# Patient Record
Sex: Male | Born: 1975 | Race: Black or African American | Hispanic: No | Marital: Single | Smoking: Heavy tobacco smoker
Health system: Southern US, Community
[De-identification: ages and names within clinical notes are randomized; demographics above are authoritative.]

## PROBLEM LIST (undated history)

## (undated) DIAGNOSIS — F32A Depression, unspecified: Secondary | ICD-10-CM

## (undated) DIAGNOSIS — Z789 Other specified health status: Secondary | ICD-10-CM

## (undated) DIAGNOSIS — F329 Major depressive disorder, single episode, unspecified: Secondary | ICD-10-CM

## (undated) DIAGNOSIS — F319 Bipolar disorder, unspecified: Secondary | ICD-10-CM

## (undated) HISTORY — PX: ACNE CYST REMOVAL: SUR1112

---

## 2002-01-23 ENCOUNTER — Emergency Department (HOSPITAL_COMMUNITY): Admission: EM | Admit: 2002-01-23 | Discharge: 2002-01-23 | Payer: Self-pay | Admitting: Emergency Medicine

## 2002-01-23 ENCOUNTER — Encounter: Payer: Self-pay | Admitting: Emergency Medicine

## 2002-09-19 ENCOUNTER — Emergency Department (HOSPITAL_COMMUNITY): Admission: EM | Admit: 2002-09-19 | Discharge: 2002-09-19 | Payer: Self-pay | Admitting: Emergency Medicine

## 2004-10-20 ENCOUNTER — Emergency Department (HOSPITAL_COMMUNITY): Admission: EM | Admit: 2004-10-20 | Discharge: 2004-10-20 | Payer: Self-pay | Admitting: *Deleted

## 2004-10-23 ENCOUNTER — Emergency Department (HOSPITAL_COMMUNITY): Admission: EM | Admit: 2004-10-23 | Discharge: 2004-10-23 | Payer: Self-pay | Admitting: Family Medicine

## 2004-10-27 ENCOUNTER — Emergency Department (HOSPITAL_COMMUNITY): Admission: EM | Admit: 2004-10-27 | Discharge: 2004-10-27 | Payer: Self-pay | Admitting: Family Medicine

## 2005-11-16 ENCOUNTER — Emergency Department (HOSPITAL_COMMUNITY): Admission: EM | Admit: 2005-11-16 | Discharge: 2005-11-16 | Payer: Self-pay | Admitting: Emergency Medicine

## 2005-12-18 ENCOUNTER — Emergency Department (HOSPITAL_COMMUNITY): Admission: EM | Admit: 2005-12-18 | Discharge: 2005-12-18 | Payer: Self-pay | Admitting: Emergency Medicine

## 2006-09-01 ENCOUNTER — Emergency Department (HOSPITAL_COMMUNITY): Admission: EM | Admit: 2006-09-01 | Discharge: 2006-09-01 | Payer: Self-pay | Admitting: Emergency Medicine

## 2007-01-04 ENCOUNTER — Emergency Department (HOSPITAL_COMMUNITY): Admission: EM | Admit: 2007-01-04 | Discharge: 2007-01-04 | Payer: Self-pay | Admitting: Emergency Medicine

## 2010-02-06 ENCOUNTER — Emergency Department (HOSPITAL_COMMUNITY): Admission: EM | Admit: 2010-02-06 | Discharge: 2010-02-07 | Payer: Self-pay | Admitting: Emergency Medicine

## 2011-01-13 LAB — CBC
HCT: 39.9 % (ref 39.0–52.0)
Hemoglobin: 13.7 g/dL (ref 13.0–17.0)
MCHC: 34.4 g/dL (ref 30.0–36.0)
MCV: 93.9 fL (ref 78.0–100.0)
Platelets: 191 10*3/uL (ref 150–400)
RBC: 4.25 MIL/uL (ref 4.22–5.81)
RDW: 13.5 % (ref 11.5–15.5)
WBC: 10.7 10*3/uL — ABNORMAL HIGH (ref 4.0–10.5)

## 2011-01-13 LAB — DIFFERENTIAL
Basophils Relative: 0 % (ref 0–1)
Lymphs Abs: 2.1 10*3/uL (ref 0.7–4.0)
Monocytes Absolute: 1.4 10*3/uL — ABNORMAL HIGH (ref 0.1–1.0)
Monocytes Relative: 13 % — ABNORMAL HIGH (ref 3–12)
Neutro Abs: 7 10*3/uL (ref 1.7–7.7)
Neutrophils Relative %: 65 % (ref 43–77)

## 2011-01-13 LAB — POCT I-STAT, CHEM 8
BUN: 22 mg/dL (ref 6–23)
Calcium, Ion: 1.12 mmol/L (ref 1.12–1.32)
Chloride: 105 mEq/L (ref 96–112)
Creatinine, Ser: 0.9 mg/dL (ref 0.4–1.5)
Glucose, Bld: 97 mg/dL (ref 70–99)
HCT: 43 % (ref 39.0–52.0)
Hemoglobin: 14.6 g/dL (ref 13.0–17.0)
Potassium: 4 mEq/L (ref 3.5–5.1)
Sodium: 138 meq/L (ref 135–145)
TCO2: 25 mmol/L (ref 0–100)

## 2011-01-19 ENCOUNTER — Emergency Department (HOSPITAL_COMMUNITY)
Admission: EM | Admit: 2011-01-19 | Discharge: 2011-01-19 | Disposition: A | Discharge status: Home / Self Care | Payer: Self-pay | Attending: Emergency Medicine | Admitting: Emergency Medicine

## 2011-01-19 DIAGNOSIS — N342 Other urethritis: Secondary | ICD-10-CM | POA: Insufficient documentation

## 2011-01-19 DIAGNOSIS — R300 Dysuria: Secondary | ICD-10-CM | POA: Insufficient documentation

## 2011-01-19 DIAGNOSIS — R369 Urethral discharge, unspecified: Secondary | ICD-10-CM | POA: Insufficient documentation

## 2011-01-19 LAB — URINALYSIS, ROUTINE W REFLEX MICROSCOPIC
Bilirubin Urine: NEGATIVE
Glucose, UA: NEGATIVE mg/dL
Hgb urine dipstick: NEGATIVE
Ketones, ur: NEGATIVE mg/dL
Nitrite: NEGATIVE
Specific Gravity, Urine: 1.022 (ref 1.005–1.030)
pH: 6 (ref 5.0–8.0)

## 2011-01-20 LAB — GC/CHLAMYDIA PROBE AMP, GENITAL: Chlamydia, DNA Probe: NEGATIVE

## 2011-01-22 ENCOUNTER — Inpatient Hospital Stay (INDEPENDENT_AMBULATORY_CARE_PROVIDER_SITE_OTHER)
Admission: RE | Admit: 2011-01-22 | Discharge: 2011-01-22 | Disposition: A | Discharge status: Home / Self Care | Payer: Self-pay | Source: Ambulatory Visit | Attending: Emergency Medicine | Admitting: Emergency Medicine

## 2011-01-22 DIAGNOSIS — R6889 Other general symptoms and signs: Secondary | ICD-10-CM

## 2011-01-22 DIAGNOSIS — K5289 Other specified noninfective gastroenteritis and colitis: Secondary | ICD-10-CM

## 2011-01-23 LAB — POCT URINALYSIS DIP (DEVICE)
Bilirubin Urine: NEGATIVE
Glucose, UA: NEGATIVE mg/dL
Protein, ur: 30 mg/dL — AB
Specific Gravity, Urine: 1.025 (ref 1.005–1.030)

## 2011-01-23 LAB — OCCULT BLOOD, POC DEVICE: Fecal Occult Bld: NEGATIVE

## 2011-02-17 ENCOUNTER — Inpatient Hospital Stay (INDEPENDENT_AMBULATORY_CARE_PROVIDER_SITE_OTHER)
Admission: RE | Admit: 2011-02-17 | Discharge: 2011-02-17 | Disposition: A | Discharge status: Home / Self Care | Payer: Self-pay | Source: Ambulatory Visit | Attending: Emergency Medicine | Admitting: Emergency Medicine

## 2011-02-17 DIAGNOSIS — R6889 Other general symptoms and signs: Secondary | ICD-10-CM

## 2011-02-17 LAB — OCCULT BLOOD, POC DEVICE: Fecal Occult Bld: NEGATIVE

## 2013-03-25 ENCOUNTER — Emergency Department (HOSPITAL_COMMUNITY)
Admission: EM | Admit: 2013-03-25 | Discharge: 2013-03-25 | Disposition: A | Discharge status: Home / Self Care | Payer: Self-pay | Attending: Emergency Medicine | Admitting: Emergency Medicine

## 2013-03-25 ENCOUNTER — Encounter (HOSPITAL_COMMUNITY): Payer: Self-pay | Admitting: *Deleted

## 2013-03-25 DIAGNOSIS — Y929 Unspecified place or not applicable: Secondary | ICD-10-CM | POA: Insufficient documentation

## 2013-03-25 DIAGNOSIS — Y9389 Activity, other specified: Secondary | ICD-10-CM | POA: Insufficient documentation

## 2013-03-25 DIAGNOSIS — S39012A Strain of muscle, fascia and tendon of lower back, initial encounter: Secondary | ICD-10-CM

## 2013-03-25 DIAGNOSIS — IMO0002 Reserved for concepts with insufficient information to code with codable children: Secondary | ICD-10-CM | POA: Insufficient documentation

## 2013-03-25 DIAGNOSIS — Y990 Civilian activity done for income or pay: Secondary | ICD-10-CM | POA: Insufficient documentation

## 2013-03-25 DIAGNOSIS — X503XXA Overexertion from repetitive movements, initial encounter: Secondary | ICD-10-CM | POA: Insufficient documentation

## 2013-03-25 MED ORDER — CYCLOBENZAPRINE HCL 10 MG PO TABS: 10.0000 mg | ORAL_TABLET | Freq: Two times a day (BID) | ORAL | Status: DC | PRN

## 2013-03-25 MED ORDER — NAPROXEN 500 MG PO TABS: 500.0000 mg | ORAL_TABLET | Freq: Two times a day (BID) | ORAL | Status: DC

## 2013-03-25 MED ORDER — HYDROCODONE-ACETAMINOPHEN 5-325 MG PO TABS: 1.0000 | ORAL_TABLET | Freq: Four times a day (QID) | ORAL | Status: DC | PRN

## 2013-03-25 NOTE — ED Provider Notes (Signed)
History  This chart was scribed for non-physician practitioner [name], PA-C working with Olivia Mackie, MD, by Candelaria Stagers, ED Scribe. This patient was seen in room WTR9/WTR9 and the patient's care was started at 11:02 PM   CSN: [id number]  Arrival date & time 03/25/13  2116   First MD Initiated Contact with Patient 03/25/13 2301      No chief complaint on file.    The history is provided by the patient. No language interpreter was used.   HPI Comments: Benjamin Butler is a 37 y.o. male who presents to the Emergency Department complaining of sharp lower back pain that started today.  Pt reports he did a lot of heavy lifting last night while at work and lifted a heavy object earlier today.  He reports the pain radiates down bilateral legs, left more than right.  He denies numbness or weakness, . Pt has no h/o back problems.  He denies loss of bowel or bladder control, dysuria or fever.  Moving makes the pain worse.  He has taken ibuprofen with no relief.    No past medical history on file.  No past surgical history on file.  No family history on file.  History  Substance Use Topics  . Smoking status: Not on file  . Smokeless tobacco: Not on file  . Alcohol Use: Not on file      Review of Systems  Musculoskeletal: Positive for back pain (lower back pain ).  All other systems reviewed and are negative.    Allergies  Review of patient's allergies indicates not on file.  Home Medications  No current outpatient prescriptions on file.  There were no vitals taken for this visit.  Physical Exam  Nursing note and vitals reviewed. Constitutional: He is oriented to person, place, and time. He appears well-developed and well-nourished. No distress.  HENT:  Head: Normocephalic and atraumatic.  Eyes: EOM are normal.  Neck: Neck supple. No tracheal deviation present.  Cardiovascular: Normal rate.   Pulmonary/Chest: Effort normal. No respiratory distress.   Musculoskeletal: Normal range of motion.  No midline lumbar spine tenderness. Para vertebral tenderness to lumbar spine.  More tenderness around L2-L3.  No pain with straight leg raise.  Normal patellar reflexes.   Neurological: He is alert and oriented to person, place, and time.  5/5 and equal LE strength bilaterally. Able to dorsiflex bilateral feet and great toes. 2+ patellar reflex bilaterally  Skin: Skin is warm and dry.  Psychiatric: He has a normal mood and affect. His behavior is normal.    ED Course  Procedures     COORDINATION OF CARE:  11:11 PM Discussed course of care with pt which includes antiinflammatory, muscle relaxer, and pain medication.  Advised pt to refrain from lifting heavy objects at work.  Will provide work note.  Advised follow up with PCP.  Pt understands and agrees.   Labs Reviewed - No data to display No results found.   1. Lumbar strain, initial encounter       MDM  Pt with acute onset of back pain yesterday while lifting heavy boxes and pulling heavy lawn mower.  No neuro deficits on exam. No signs of cauda equina. No fever. Denies iv drug use. Will start on naprosyn. norco for break through pain. Flexeril for spasms. Follow up with PCP.   I personally performed the services described in this documentation, which was scribed in my presence. The recorded information has been reviewed and is accurate.  Lottie Mussel, PA-C 03/25/13 2322

## 2013-03-25 NOTE — ED Notes (Signed)
Pt was lifting lawn mower and felt sharp pain in lower back

## 2013-03-30 NOTE — ED Provider Notes (Signed)
Medical screening examination/treatment/procedure(s) were performed by non-physician practitioner and as supervising physician I was immediately available for consultation/collaboration.  Havard Radigan, MD 03/30/13 0513 

## 2013-12-18 ENCOUNTER — Encounter (HOSPITAL_COMMUNITY): Payer: Self-pay | Admitting: Emergency Medicine

## 2013-12-18 ENCOUNTER — Emergency Department (HOSPITAL_COMMUNITY)
Admission: EM | Admit: 2013-12-18 | Discharge: 2013-12-19 | Disposition: A | Discharge status: Home / Self Care | Payer: Self-pay | Attending: Emergency Medicine | Admitting: Emergency Medicine

## 2013-12-18 DIAGNOSIS — R350 Frequency of micturition: Secondary | ICD-10-CM | POA: Insufficient documentation

## 2013-12-18 DIAGNOSIS — R45851 Suicidal ideations: Secondary | ICD-10-CM | POA: Insufficient documentation

## 2013-12-18 DIAGNOSIS — R631 Polydipsia: Secondary | ICD-10-CM | POA: Insufficient documentation

## 2013-12-18 DIAGNOSIS — F3289 Other specified depressive episodes: Secondary | ICD-10-CM | POA: Insufficient documentation

## 2013-12-18 DIAGNOSIS — F17200 Nicotine dependence, unspecified, uncomplicated: Secondary | ICD-10-CM | POA: Insufficient documentation

## 2013-12-18 DIAGNOSIS — R45850 Homicidal ideations: Secondary | ICD-10-CM | POA: Insufficient documentation

## 2013-12-18 DIAGNOSIS — F329 Major depressive disorder, single episode, unspecified: Secondary | ICD-10-CM

## 2013-12-18 DIAGNOSIS — F6381 Intermittent explosive disorder: Secondary | ICD-10-CM

## 2013-12-18 DIAGNOSIS — R443 Hallucinations, unspecified: Secondary | ICD-10-CM | POA: Insufficient documentation

## 2013-12-18 DIAGNOSIS — F32A Depression, unspecified: Secondary | ICD-10-CM

## 2013-12-18 LAB — COMPREHENSIVE METABOLIC PANEL
ALT: 18 U/L (ref 0–53)
AST: 21 U/L (ref 0–37)
Albumin: 4.3 g/dL (ref 3.5–5.2)
Alkaline Phosphatase: 72 U/L (ref 39–117)
BUN: 9 mg/dL (ref 6–23)
CALCIUM: 9.8 mg/dL (ref 8.4–10.5)
CO2: 24 meq/L (ref 19–32)
CREATININE: 0.85 mg/dL (ref 0.50–1.35)
Chloride: 98 mEq/L (ref 96–112)
GFR calc Af Amer: >90 mL/min (ref >90)
Glucose, Bld: 115 mg/dL — ABNORMAL HIGH (ref 70–99)
Potassium: 3.6 mEq/L — ABNORMAL LOW (ref 3.7–5.3)
Sodium: 137 mEq/L (ref 137–147)
Total Bilirubin: 0.5 mg/dL (ref 0.3–1.2)
Total Protein: 7.5 g/dL (ref 6.0–8.3)

## 2013-12-18 LAB — RAPID URINE DRUG SCREEN, HOSP PERFORMED
Amphetamines: NOT DETECTED
Barbiturates: NOT DETECTED
Benzodiazepines: NOT DETECTED
COCAINE: NOT DETECTED
OPIATES: NOT DETECTED
TETRAHYDROCANNABINOL: POSITIVE — AB

## 2013-12-18 LAB — CBC
HEMATOCRIT: 43 % (ref 39.0–52.0)
Hemoglobin: 14.6 g/dL (ref 13.0–17.0)
MCH: 30.7 pg (ref 26.0–34.0)
MCHC: 34 g/dL (ref 30.0–36.0)
MCV: 90.5 fL (ref 78.0–100.0)
PLATELETS: 245 10*3/uL (ref 150–400)
RBC: 4.75 MIL/uL (ref 4.22–5.81)
RDW: 12.7 % (ref 11.5–15.5)
WBC: 11 10*3/uL — ABNORMAL HIGH (ref 4.0–10.5)

## 2013-12-18 LAB — ETHANOL

## 2013-12-18 NOTE — ED Provider Notes (Signed)
CSN: [id number]     Arrival date & time 12/18/13  [id number] History  This chart was scribed for non-physician practitioner Earley FavorGail Slade Pierpoint, NP working with Gavin PoundMichael Y. Oletta LamasGhim, MD by Dorothey Basemania Sutton, ED Scribe. This patient was seen in room WLCON/WLCON and the patient's care was started at 10:20 PM.    Chief Complaint  Patient presents with  . Medical Clearance  . Suicidal    The history is provided by the patient. No language interpreter was used.   HPI Comments: Benjamin Butler is a 38 y.o. male who presents to the Emergency Department requesting medical clearance for suicidal and homicidal ideations. He states that he has also been experiencing some increasing depressive-like symptoms for the past 2-3 months, which has made it difficult to go to work and perform other routine, daily activities. Patient states that he has thought about using a gun to hurt others, which is unusual for him and he states that he is scared because he does not want to carry out these thoughts. He endorses experiencing some auditory hallucinations that tell him to hurt others. Patient states that his current ideations presented after breaking up with his long-term girlfriend. Patient presents voluntarily with his Mobile Information systems managerCrisis worker, who is currently working on finding the patient a bed. He denies any regular medication use. Patient has no other pertinent medical history. Patient reports a familial history of diabetes and states that he has felt more thirsty lately with associated urinary frequency.   History reviewed. No pertinent past medical history. History reviewed. No pertinent past surgical history. History reviewed. No pertinent family history. History  Substance Use Topics  . Smoking status: Heavy Tobacco Smoker -- 0.50 packs/day  . Smokeless tobacco: Not on file  . Alcohol Use: No    Review of Systems  Psychiatric/Behavioral: Positive for suicidal ideas and hallucinations.  All other systems reviewed and are  negative.   Allergies  Review of patient's allergies indicates no known allergies.  Home Medications  No current outpatient prescriptions on file.  Triage Vitals: BP 125/86  Pulse 78  Temp(Src) 98.3 F (36.8 C) (Oral)  Resp 18  Ht 6\' 1"  (1.854 m)  Wt 208 lb (94.348 kg)  BMI 27.45 kg/m2  SpO2 95%  Physical Exam  Nursing note and vitals reviewed. Constitutional: He is oriented to person, place, and time. He appears well-developed and well-nourished. No distress.  HENT:  Head: Normocephalic and atraumatic.  Eyes: Conjunctivae are normal.  Neck: Normal range of motion. Neck supple.  Pulmonary/Chest: Effort normal. No respiratory distress.  Abdominal: He exhibits no distension.  Musculoskeletal: Normal range of motion.  Neurological: He is alert and oriented to person, place, and time.  Skin: Skin is warm and dry.  Psychiatric: His speech is normal and behavior is normal. Cognition and memory are normal. He expresses inappropriate judgment. He exhibits a depressed mood. He expresses homicidal and suicidal ideation. He expresses suicidal plans. He expresses no homicidal plans.    ED Course  Procedures (including critical care time)  DIAGNOSTIC STUDIES: Oxygen Saturation is 95% on room air, adequate by my interpretation.    COORDINATION OF CARE: 10:26 PM- Ordered blood labs and UA. Discussed that patient's CBG level was 115, which is not concerning for diabetes. Discussed treatment plan with patient at bedside and patient verbalized agreement.     Labs Review Labs Reviewed  CBC - Abnormal; Notable for the following:    WBC 11.0 (*)    All other components within normal limits  COMPREHENSIVE METABOLIC PANEL - Abnormal; Notable for the following:    Potassium 3.6 (*)    Glucose, Bld 115 (*)    All other components within normal limits  URINE RAPID DRUG SCREEN (HOSP PERFORMED) - Abnormal; Notable for the following:    Tetrahydrocannabinol POSITIVE (*)    All other  components within normal limits  ETHANOL   Imaging Review No results found.  EKG Interpretation   None      as his labs have been reviewed.  He is medically cleared for admission.  [facility] is working on bed procurement  MDM   Final diagnoses:  None        I personally performed the services described in this documentation, which was scribed in my presence. The recorded information has been reviewed and is accurate.    Arman Filter, NP 12/18/13 2234

## 2013-12-18 NOTE — ED Provider Notes (Signed)
Medical screening examination/treatment/procedure(s) were performed by non-physician practitioner and as supervising physician I was immediately available for consultation/collaboration.  EKG Interpretation   None         Gavin PoundMichael Y. Oletta LamasGhim, MD 12/18/13 [id number]
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
## 2013-12-19 ENCOUNTER — Encounter (HOSPITAL_COMMUNITY): Payer: Self-pay | Admitting: Registered Nurse

## 2013-12-19 ENCOUNTER — Encounter (HOSPITAL_COMMUNITY): Payer: Self-pay | Admitting: Behavioral Health

## 2013-12-19 ENCOUNTER — Inpatient Hospital Stay (HOSPITAL_COMMUNITY)
Admission: AD | Admit: 2013-12-19 | Discharge: 2013-12-26 | DRG: 885 | Disposition: A | Discharge status: Home / Self Care | Payer: Federal, State, Local not specified - Other | Source: Intra-hospital | Attending: Psychiatry | Admitting: Psychiatry

## 2013-12-19 DIAGNOSIS — F6381 Intermittent explosive disorder: Secondary | ICD-10-CM

## 2013-12-19 DIAGNOSIS — F1210 Cannabis abuse, uncomplicated: Secondary | ICD-10-CM | POA: Diagnosis present

## 2013-12-19 DIAGNOSIS — R45850 Homicidal ideations: Secondary | ICD-10-CM

## 2013-12-19 DIAGNOSIS — F323 Major depressive disorder, single episode, severe with psychotic features: Principal | ICD-10-CM | POA: Diagnosis present

## 2013-12-19 DIAGNOSIS — G4700 Insomnia, unspecified: Secondary | ICD-10-CM | POA: Diagnosis present

## 2013-12-19 DIAGNOSIS — F259 Schizoaffective disorder, unspecified: Secondary | ICD-10-CM | POA: Diagnosis present

## 2013-12-19 DIAGNOSIS — R45851 Suicidal ideations: Secondary | ICD-10-CM

## 2013-12-19 MED ORDER — TRAZODONE HCL 50 MG PO TABS
50.0000 mg | ORAL_TABLET | Freq: Every evening | ORAL | Status: DC | PRN
  Administered 2013-12-20 – 2013-12-21 (×2): 50 mg via ORAL
  Filled 2013-12-19 (×7): qty 1

## 2013-12-19 MED ORDER — QUETIAPINE FUMARATE ER 50 MG PO TB24
50.0000 mg | ORAL_TABLET | Freq: Every day | ORAL | Status: DC
  Administered 2013-12-19 – 2013-12-21 (×3): 50 mg via ORAL
  Filled 2013-12-19 (×5): qty 1

## 2013-12-19 MED ORDER — NICOTINE POLACRILEX 2 MG MT GUM: 2.0000 mg | CHEWING_GUM | OROMUCOSAL | Status: DC | PRN

## 2013-12-19 MED ORDER — ACETAMINOPHEN 325 MG PO TABS: 650.0000 mg | ORAL_TABLET | Freq: Four times a day (QID) | ORAL | Status: DC | PRN

## 2013-12-19 MED ORDER — NICOTINE POLACRILEX 2 MG MT GUM
2.0000 mg | CHEWING_GUM | OROMUCOSAL | Status: DC | PRN
Start: 2013-12-19 — End: 2013-12-26
  Administered 2013-12-20 – 2013-12-26 (×24): 2 mg via ORAL
  Filled 2013-12-19 (×12): qty 1

## 2013-12-19 MED ORDER — MAGNESIUM HYDROXIDE 400 MG/5ML PO SUSP: 30.0000 mL | Freq: Every day | ORAL | Status: DC | PRN

## 2013-12-19 MED ORDER — ALUM & MAG HYDROXIDE-SIMETH 200-200-20 MG/5ML PO SUSP: 30.0000 mL | ORAL | Status: DC | PRN

## 2013-12-19 NOTE — ED Notes (Signed)
Patient denies SI and HI and AVH at this time. No acute distress noted.

## 2013-12-19 NOTE — Consult Note (Signed)
Face to face evaluation and I agree with this note 

## 2013-12-19 NOTE — BH Assessment (Signed)
[facility] per Shuvon Rankin,NP once medically cleared. Pt medically cleared and [facility] Coyer[facility] Kaplan. Dr. Jannifer FranklinAkintayo [facility] the [facility] attending [facility]. All support paperwork completed to include ROI, Voluntary consent, and Admission Checklist. Notified EDP Dr. Criss AlvineGoldston [facility].   Glorious PeachNajah Seri Kimmer, MS, LCASA Assessment Counselor
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
## 2013-12-19 NOTE — Progress Notes (Signed)
[facility] will present the referral to the doctor after 7am, per nurse [facility].

## 2013-12-20 DIAGNOSIS — F6381 Intermittent explosive disorder: Secondary | ICD-10-CM

## 2013-12-20 DIAGNOSIS — F323 Major depressive disorder, single episode, severe with psychotic features: Principal | ICD-10-CM | POA: Diagnosis present

## 2013-12-20 MED ORDER — FLUOXETINE HCL 20 MG PO CAPS
20.0000 mg | ORAL_CAPSULE | Freq: Every day | ORAL | Status: DC
  Administered 2013-12-20 – 2013-12-25 (×6): 20 mg via ORAL
  Filled 2013-12-20 (×7): qty 1

## 2013-12-20 NOTE — Progress Notes (Signed)
Patient ID: Benjamin DibbleScott X Butler, male   DOB: 02/29/1976, 38 y.o.   MRN: [id number] Spoke with patient's sister, Sheran Favaina Birch.  Her number is listed on his consent form.  She discussed her concern over her brother and his recent behavior.  She stated her brother has never lived on his own. She is encouraging him to get out of his current behavior, stating, "I was afraid someone would get hurt."  She stated that his behavior has been different the last couple of months and that she is afraid of him.  She expressed her support of patient.
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
## 2013-12-20 NOTE — Progress Notes (Signed)
NUTRITION ASSESSMENT  Pt identified as at risk on the Malnutrition Screen Tool  INTERVENTION: 1. Educated patient on the importance of nutrition and encouraged intake of food and beverages. 2. Discussed weight goals. 3. Supplements: none at this time.  NUTRITION DIAGNOSIS: Unintentional weight loss related to sub-optimal intake as evidenced by pt report.   Goal: Pt to meet >/= 90% of their estimated nutrition needs.  Monitor:  PO intake  Assessment:  Patient admitted for HI and AH.  Decreased appetite since New Years which patient reports is secondary to stress from relationship issues with his girlfriend.  Patient reports UBW of 225 lbs with 25 lb loss in the past 2 months.  Reports good intake currently.  38 y.o. male  Height: Ht Readings from Last 1 Encounters:  12/19/13 6' 0.2" (1.834 m)    Weight: Wt Readings from Last 1 Encounters:  12/19/13 200 lb (90.719 kg)    Weight Hx: Wt Readings from Last 10 Encounters:  12/19/13 200 lb (90.719 kg)  12/18/13 208 lb (94.348 kg)    BMI:  Body mass index is 26.97 kg/(m^2). Pt meets criteria for overweight based on current BMI however, weight appropriate for body frame.  Estimated Nutritional Needs: Kcal: 25-30 kcal/kg Protein: > 1 gram protein/kg Fluid: 1 ml/kcal  Diet Order: General Pt is also offered choice of unit snacks mid-morning and mid-afternoon.  Pt is eating as desired.   Lab results and medications reviewed.   Oran ReinLaura Baldwin Racicot, RD, LDN Clinical Inpatient Dietitian Pager:  [phone] Weekend and after hours pager:  [phone]
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
## 2013-12-20 NOTE — [facility] Suicide Risk Assessment (Signed)
   Nursing information obtained from:  Patient Demographic factors:  Male;Low socioeconomic status;Unemployed;Access to firearms Current Mental Status:  Thoughts of violence towards others;Self-harm thoughts Loss Factors:  Loss of significant relationship;Financial problems / change in socioeconomic status Historical Factors:  Family history of mental illness or substance abuse Risk Reduction Factors:  Living with another person, especially a relative Total Time spent with patient: 20 minutes  CLINICAL FACTORS:   Severe Anxiety and/or Agitation Depression:   Aggression Anhedonia Comorbid alcohol abuse/dependence Delusional Hopelessness Impulsivity Insomnia Alcohol/Substance Abuse/Dependencies Currently Psychotic  Psychiatric Specialty Exam: Physical Exam  Psychiatric: His mood appears anxious. His affect is labile. His speech is rapid and/or pressured. He is agitated, aggressive and actively hallucinating. Thought content is paranoid. Cognition and memory are normal. He expresses impulsivity. He exhibits a depressed mood. He expresses homicidal and suicidal ideation.    Review of Systems  Constitutional: Negative.   HENT: Negative.   Eyes: Negative.   Respiratory: Negative.   Cardiovascular: Negative.   Gastrointestinal: Negative.   Genitourinary: Negative.   Musculoskeletal: Negative.   Skin: Negative.   Neurological: Negative.   Endo/Heme/Allergies: Negative.   Psychiatric/Behavioral: Positive for suicidal ideas, hallucinations and substance abuse. The patient is nervous/anxious and has insomnia.     Blood pressure 125/86, pulse 100, temperature 97.9 F (36.6 C), temperature source Oral, resp. rate 20, height 6' 0.21" (1.834 m), weight 90.719 kg (200 lb).Body mass index is 26.97 kg/(m^2).  General Appearance: Disheveled  Eye SolicitorContact::  Fair  Speech:  Clear and Coherent  Volume:  Normal  Mood:  Depressed, Dysphoric and Hopeless  Affect:  Depressed  Thought Process:   Goal Directed  Orientation:  Full (Time, Place, and Person)  Thought Content:  Hallucinations: Auditory Visual  Suicidal Thoughts:  Yes.  without intent/plan  Homicidal Thoughts:  Yes.  with intent/plan  Memory:  Immediate;   Fair Recent;   Fair Remote;   Fair  Judgement:  Poor  Insight:  Shallow  Psychomotor Activity:  Decreased  Concentration:  Fair  Recall:  [employer]  Fund of Knowledge:Fair  Language: Good  Akathisia:  No  Handed:  Right  AIMS (if indicated):     Assets:  Communication Skills Desire for Improvement Physical Health  Sleep:  Number of Hours: 4   Musculoskeletal: Strength & Muscle Tone: within normal limits Gait & Station: normal Patient leans: N/A  COGNITIVE FEATURES THAT CONTRIBUTE TO RISK:  Closed-mindedness Polarized thinking    SUICIDE RISK:   Mild:  Suicidal ideation of limited frequency, intensity, duration, and specificity.  There are no identifiable plans, no associated intent, mild dysphoria and related symptoms, good self-control (both objective and subjective assessment), few other risk factors, and identifiable protective factors, including available and accessible social support.  PLAN OF CARE: 1. Admit for crisis management and stabilization. 2. Medication management to reduce current symptoms to base line and improve the  patient's overall level of functioning 3. Treat health problems as indicated. 4. Develop treatment plan to decrease risk of relapse upon discharge and the need for  readmission. 5. Psycho-social education regarding relapse prevention and self care. 6. Health care follow up as needed for medical problems. 7. Restart home medications where appropriate.   I certify that inpatient services furnished can reasonably be expected to improve the patient's condition.  Thedore MinsAkintayo, Molley Houser, MD 12/20/2013, 11:51 AM

## 2013-12-20 NOTE — [facility] Group Notes (Signed)
[facility]  12/20/2013  12:18 PM  Type of Therapy:  Mental Health Association Presentation   Participation Level:  Active  Participation Quality:  Appropriate  Affect:  Appropriate  Cognitive:  Appropriate  Insight:  Engaged  Engagement in Therapy:  Engaged  Modes of Intervention:  Discussion, Education and Socialization   Summary of Progress/Problems:  Onalee HuaDavid from [employer] came to present his recovery story and play the guitar.  [name] asked the speaker about his experiences with recovering from substance abuse.  He discussed with the speaker  different coping strategies used for panic attacks and mental illness.  He indicated that he would be [facility].  He tapped his feet and nodded his head while the speaker played his guitar. He thanked the speaker for coming.      Benjamin Butler   12/20/2013  12:18 PM

## 2013-12-20 NOTE — Progress Notes (Signed)
This evening, pt has been appropriate on the unit, pleasant/cooperative.  He reports he has had a good day.  He reports no side effects from the medication he took last night.  Pt denies SI/HI at this time.  He says that he sometimes hears the voices of demons.  Pt makes his needs known to staff.  He is unsure what his discharge plans are at this point.  Support and encouragement offered.  Safety maintained with q15 minute checks.

## 2013-12-20 NOTE — Progress Notes (Signed)
The focus of this group is to help patients review their daily goal of treatment and discuss progress on daily workbooks. Pt attended the evening group session and responded to all discussion prompts from the Writer. Pt shared that today was a good day, the highlight of which was a positive conversation he had with another pt. He also said that today he was working on better controlling his anger. "I think I almost have it where it needs to be." Pt reported having no additional needs from Nursing Staff this evening. Pt's affect was appropriate and the Writer observed the Pt making encouraging comments to his peers.

## 2013-12-20 NOTE — [facility] Group Notes (Signed)
Adult Psychoeducational Group Note  Date:  12/20/2013 Time:  1:43 PM  Group Topic/Focus:  Coping With Mental Health Crisis:   The purpose of this group is to help patients identify strategies for coping with mental health crisis.  Group discusses possible causes of crisis and ways to manage them effectively.   Participation Level:  Active  Participation Quality:  Appropriate  Affect:  Appropriate  Cognitive:  Alert  Insight: Appropriate  Engagement in Group:  Engaged  Modes of Intervention:  Discussion, Education and Support  Additional Comments:  Pt stated he likes to talk with other people when he is going through a crisis to get their advice. Pt also likes to cope by listening to music.   Benjamin Butler P 12/20/2013, 1:43 PM
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
## 2013-12-21 DIAGNOSIS — F1210 Cannabis abuse, uncomplicated: Secondary | ICD-10-CM | POA: Diagnosis present

## 2013-12-21 NOTE — Progress Notes (Signed)
Adult Psychoeducational Group Note  Date:  12/21/2013 Time:  10:44 PM  Group Topic/Focus:  Wrap-Up Group:   The focus of this group is to help patients review their daily goal of treatment and discuss progress on daily workbooks.  Participation Level:  Active  Participation Quality:  Appropriate  Affect:  Appropriate  Cognitive:  Appropriate  Insight: Good  Engagement in Group:  Engaged  Modes of Intervention:  Discussion  Additional Comments:    Alisah Grandberry A 12/21/2013, 10:44 PM

## 2013-12-21 NOTE — Progress Notes (Signed)
D: Pt denies SI/HI/AVH. Pt is pleasant and cooperative. 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 Pt seems to be handling his past HI toward his girlfriend better, he stated he is ready to move on, maybe live in another state.   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 Pt was offered support and encouragement. Pt was given scheduled medications. Pt was encourage to attend groups. Q 15 minute checks were done for safety.   R:Pt attends groups and interacts well with peers and staff. Pt is taking medication. Pt has no complaints at this time.Pt receptive to treatment and safety maintained on unit.

## 2013-12-21 NOTE — [facility] Suicide Risk Assessment (Addendum)
[facility] INPATIENT:  Family/Significant Other Suicide Prevention Education  Suicide Prevention Education:  Education Completed; Margaret PyleKatina Burch,  [phone] [id number],  Sister,  has been identified by the patient as the family member/significant other with whom the patient will be residing, and identified as the person(s) who will aid the patient in the event of a mental health crisis (suicidal ideations/suicide attempt).  With written consent from the patient, the family member/significant other has been provided the following suicide prevention education, prior to the and/or following the discharge of the patient.  The suicide prevention education provided includes the following:  Suicide risk factors  Suicide prevention and interventions  National Suicide Hotline telephone number  [facility] assessment telephone number  [facility] Assistance 911  [facility] and/or Residential Mobile Crisis Unit telephone number  Request made of family/significant other to:  Remove weapons (e.g., guns, rifles, knives), all items previously/currently identified as safety concern.    Remove drugs/medications (over-the-counter, prescriptions, illicit drugs), all items previously/currently identified as a safety concern.  The family member/significant other verbalizes understanding of the suicide prevention education information provided.  The family member/significant other agrees to remove the items of safety concern listed above.  She knows of no guns, but states "he probably has access to some."  CSW will follow up with pt. 12/22/13  Pt assures me that gun was returned to owner as confirmed by girlfriend.  I left a message at [phone], Alphonzo DublinBrandy Lloyd, asking her to confirm.  Daryel Geraldorth, [place] [address] B 12/21/2013, 10:59 AM
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
## 2013-12-21 NOTE — [facility] Group Notes (Signed)
[facility] Group Notes:  (Counselor/Nursing/MHT/Case Management/Adjunct)  12/21/2013 1:15PM  Type of Therapy:  Group Therapy  Participation Level:  Active  Participation Quality:  Appropriate  Affect:  Flat  Cognitive:  Oriented  Insight:  Improving  Engagement in Group:  Engaged  Engagement in Therapy:  Engaged  Modes of Intervention:  Discussion, Exploration and Socialization  Summary of Progress/Problems: The topic for group was balance in life.  Pt participated in the discussion about when their life was in balance and out of balance and how this feels.  Pt discussed ways to get back in balance and short term goals they can work on to get where they want to be.   [name] was very active in group; in fact, had to cut him off several times because he had a reaction to everything that others were saying.  But he was fairly easily redirected.  He talked about how bottling up emotions causes him not only physical discomfort, but leads to explosions that are problematic.  "I guess I either end up in the hospital or in jail."  He talked about wanting thing sto be different going forward.  He talked about distractions like basketball and yoga, as well as working to leave behind relationships.   Daryel Geraldorth, Anaiyah Anglemyer B 12/21/2013 12:04 PM

## 2013-12-21 NOTE — Progress Notes (Signed)
D: Patient denies SI and visual hallucinations. The patient is positive for auditory hallucinations. The patient is having some HI towards his ex-girlfriend but states "I don't feel hatred toward her now. I really just want to make things work. I don't want to do anything that can hurt her but I just have thoughts sometime." Patient contracts for safety (promises to talk with staff about his HI). The patient has an anxious mood and affect. The patient is attending groups and interacting appropriately within the milieu.  A: Patient given emotional support from RN. Patient encouraged to come to staff with concerns and/or questions. Patient's medication routine continued. Patient's orders and plan of care reviewed.  R: Patient remains appropriate and cooperative. Will continue to monitor patient q15 minutes for safety.

## 2013-12-22 LAB — GC/CHLAMYDIA PROBE AMP
CT PROBE, AMP APTIMA: NEGATIVE
GC Probe RNA: NEGATIVE

## 2013-12-22 LAB — RPR: RPR Ser Ql: NONREACTIVE

## 2013-12-22 LAB — HIV ANTIBODY (ROUTINE TESTING W REFLEX): HIV: NONREACTIVE

## 2013-12-22 LAB — HSV(HERPES SIMPLEX VRS) I + II AB-IGG
HSV 1 Glycoprotein G Ab, IgG: <0.1 IV
HSV 2 Glycoprotein G Ab, IgG: <0.1 IV

## 2013-12-22 MED ORDER — TRAZODONE HCL 50 MG PO TABS
50.0000 mg | ORAL_TABLET | Freq: Every evening | ORAL | Status: DC | PRN
  Administered 2013-12-22 – 2013-12-24 (×4): 50 mg via ORAL
  Filled 2013-12-22 (×8): qty 1

## 2013-12-22 MED ORDER — TRAZODONE HCL 100 MG PO TABS: 100.0000 mg | ORAL_TABLET | Freq: Every evening | ORAL | Status: DC | PRN

## 2013-12-22 MED ORDER — QUETIAPINE FUMARATE ER 50 MG PO TB24
100.0000 mg | ORAL_TABLET | Freq: Every day | ORAL | Status: DC
  Administered 2013-12-22 – 2013-12-24 (×3): 100 mg via ORAL
  Filled 2013-12-22 (×4): qty 2

## 2013-12-22 NOTE — Progress Notes (Signed)
Adult Psychoeducational Group Note  Date:  12/22/2013 Time:  1010        Group Topic/Focus:  Relapse Prevention Planning:   The focus of this group is to define relapse and discuss the need for planning to combat relapse.  Participation Level:  Active  Participation Quality:  Appropriate  Affect:  Appropriate  Cognitive:  Appropriate  Insight: Good  Engagement in Group:  Engaged and Supportive  Modes of Intervention:  Discussion and Education  Additional Comments:    Colbert EwingDUNCAN, Sharlie Shreffler A 12/22/2013, 11:42 AM

## 2013-12-22 NOTE — Progress Notes (Signed)
Adult Psychoeducational Group Note  Date:  12/22/2013 Time:  9:59 PM  Group Topic/Focus:  Wrap-Up Group:   The focus of this group is to help patients review their daily goal of treatment and discuss progress on daily workbooks.  Participation Level:  Active  Participation Quality:  Appropriate  Affect:  Appropriate  Cognitive:  Alert  Insight: Appropriate  Engagement in Group:  Engaged  Modes of Intervention:  Discussion  Additional Comments:  Pt stated that he had a good day, he was able to talk with his boss and he may have his old job back. Pt tends to ramble when he talks, also manic like but he is redirectable.  Kaleen OdeaCOOKE, Benjamin Williamsen R 12/22/2013, 9:59 PM

## 2013-12-22 NOTE — Progress Notes (Signed)
D:Pt reports that he is better with less racing thoughts. He denies voices at this time and states that he sometimes hears voices that tell him to kill others. Pt said that he often listens to music called "chop and screw." He referenced that it was demonic and he needed to go to church and be cleansed. He said that he would sometimes get in the dark and listen to music backward. He would also sleep with 3 Bibles surrounding him. A:Supported pt to discuss feelings. Offered encouragement and 15 minute checks. R:Pt denies si and hi. Safety maintained on the unit.

## 2013-12-22 NOTE — Progress Notes (Signed)
Patient ID: Benjamin Butler, male   DOB: 27-Jul-1976, 38 y.o.   MRN: [id number] [facility] MD Progress Note  12/22/2013 10:07 AM Benjamin Butler  MRN:  [id number] Subjective: " I am getting my life back together, my boss at [employer] just told that I can get my job back.'' Objective: Patient reports decreased mood lability, irritable mood, agitations, auditory/visual hallucinations, anxiety and depressive symptoms. He states that he has been sleeping much better. However, he admits to racing thoughts and says that he can't help but worry about his ex-girlfriend sometime. But states that he is ready to move on without her if that's what he has to do to avoid getting into trouble. He denies suicidal or homicidal ideation, intent or plan today. Patient has been attending the unit milieu and compliant with his medications. His blood reports was reviewed and showed negative HIV/RPR.  Diagnosis:   DSM5: Schizophrenia Disorders:  Delusional Disorder (297.1) and  Psychotic Disorder (298.8) Obsessive-Compulsive Disorders:   Trauma-Stressor Disorders:   Substance/Addictive Disorders:  Cannabis Use Disorder - Moderate 9304.30) Depressive Disorders:  Major Depressive Disorder - with Psychotic Features (296.24) Total Time spent with patient: 20 minutes  Axis I: Major depressive disorder, single episode, severe, specified as with psychotic behavior           Intermittent explosive disorder           Cannabis use disorder  Axis II: Deferred Axis III: History reviewed. No pertinent past medical history. Axis IV: other psychosocial or environmental problems, problems related to social environment and problems with primary support group  ADL's:  Intact  Sleep: Fair  Appetite:  Fair  Suicidal Ideation: yes Plan:  denies Intent:  denies Means:  denies Homicidal Ideation: yes Plan:  denies Intent:  denies Means:  denies AEB (as evidenced by):  Psychiatric Specialty Exam: Physical Exam  Psychiatric: His speech is  normal and behavior is normal. Judgment normal. His mood appears anxious. Thought content is paranoid. Cognition and memory are normal. He exhibits a depressed mood.    Review of Systems  Constitutional: Negative.   HENT: Negative.   Eyes: Negative.   Respiratory: Negative.   Cardiovascular: Negative.   Gastrointestinal: Negative.   Genitourinary: Negative.   Musculoskeletal: Negative.   Skin: Negative.   Neurological: Negative.   Endo/Heme/Allergies: Negative.   Psychiatric/Behavioral: Positive for depression, hallucinations and substance abuse. The patient is nervous/anxious.     Blood pressure 121/92, pulse 101, temperature 97.7 F (36.5 C), temperature source Oral, resp. rate 16, height 6' 0.21" (1.834 m), weight 90.719 kg (200 lb).Body mass index is 26.97 kg/(m^2).  General Appearance: Disheveled  Eye Solicitor::  Fair  Speech:  Clear and Coherent  Volume:  Normal  Mood:  Angry, Depressed and Irritable  Affect:  Labile and Tearful  Thought Process:  Coherent and Goal Directed  Orientation:  Full (Time, Place, and Person)  Thought Content:  Delusions and Hallucinations: Auditory  Suicidal Thoughts:  Yes.  without intent/plan  Homicidal Thoughts:  Yes.  without intent/plan  Memory:  Immediate;   Fair Recent;   Fair Remote;   Fair  Judgement:  Poor  Insight:  Lacking  Psychomotor Activity:  Increased  Concentration:  Fair  Recall:  [employer] of Knowledge:Fair  Language: Good  Akathisia:  No  Handed:  Right  AIMS (if indicated):     Assets:  Communication Skills Desire for Improvement Physical Health Social Support  Sleep:  Number of Hours: 5.5   Musculoskeletal: Strength &  Muscle Tone: within normal limits Gait & Station: normal Patient leans: N/A  Current Medications: Current Facility-Administered Medications  Medication Dose Route Frequency Provider Last Rate Last Dose  . acetaminophen (TYLENOL) tablet 650 mg  650 mg Oral Q6H PRN Kerry Hough, PA-C       . alum & mag hydroxide-simeth (MAALOX/MYLANTA) 200-200-20 MG/5ML suspension 30 mL  30 mL Oral Q4H PRN Kerry Hough, PA-C      . FLUoxetine (PROZAC) capsule 20 mg  20 mg Oral Daily Doyal Saric   20 mg at 12/22/13 0729  . magnesium hydroxide (MILK OF MAGNESIA) suspension 30 mL  30 mL Oral Daily PRN Kerry Hough, PA-C      . nicotine polacrilex (NICORETTE) gum 2 mg  2 mg Oral PRN Kerry Hough, PA-C   2 mg at 12/22/13 7829  . QUEtiapine (SEROQUEL XR) 24 hr tablet 100 mg  100 mg Oral QHS Yomayra Tate      . traZODone (DESYREL) tablet 50 mg  50 mg Oral QHS,MR X 1 Ezreal Turay        Lab Results:  Results for orders placed during the hospital encounter of 12/19/13 (from the past 48 hour(s))  GC/CHLAMYDIA PROBE AMP     Status: None   Collection Time    12/21/13  4:43 PM      Result Value Ref Range   CT Probe RNA NEGATIVE  NEGATIVE   GC Probe RNA NEGATIVE  NEGATIVE   Comment: (NOTE)                                                                                               **Normal Reference Range: Negative**          Assay performed using the Gen-Probe APTIMA COMBO2 (R) Assay.     Acceptable specimen types for this assay include APTIMA Swabs (Unisex,     endocervical, urethral, or vaginal), first void urine, and ThinPrep     liquid based cytology samples.     Performed at [facility]  HIV ANTIBODY (ROUTINE TESTING)     Status: None   Collection Time    12/21/13  7:56 PM      Result Value Ref Range   HIV NON REACTIVE  NON REACTIVE   Comment: Performed at [facility]  RPR     Status: None   Collection Time    12/21/13  7:56 PM      Result Value Ref Range   RPR NON REACTIVE  NON REACTIVE   Comment: Performed at [facility]    Physical Findings: AIMS: Facial and Oral Movements Muscles of Facial Expression: None, normal Lips and Perioral Area: None, normal Jaw: None, normal Tongue: None, normal,Extremity Movements Upper (arms, wrists,  hands, fingers): None, normal Lower (legs, knees, ankles, toes): None, normal, Trunk Movements Neck, shoulders, hips: None, normal, Overall Severity Severity of abnormal movements (highest score from questions above): None, normal Incapacitation due to abnormal movements: None, normal Patient's awareness of abnormal movements (rate only patient's report): No Awareness, Dental Status Current problems with teeth and/or dentures?: No Does  patient usually wear dentures?: No  CIWA:    COWS:     Treatment Plan Summary: Daily contact with patient to assess and evaluate symptoms and progress in treatment Medication management  Plan:1. Admit for crisis management and stabilization. 2. Medication management to reduce current symptoms to base line and improve the  patient's overall level of functioning: Will continue Prozac 20mg  daily for depression, increase Seroquel to 100mg  qhs for insomnia and psychosis 3. Treat health problems as indicated. 4. Develop treatment plan to decrease risk of relapse upon discharge and the need for  readmission. 5. Psycho-social education regarding relapse prevention and self care. 6. Health care follow up as needed for medical problems. 7. Awaiting blood test report for Herpes.   Medical Decision Making Problem Points:  Established problem, improving (1), Review of last therapy session (1) and Review of psycho-social stressors (1) Data Points:  Order Aims Assessment (2) Review or order clinical lab tests (1) Review of medication regiment & side effects (2) Review of new medications or change in dosage (2)  I certify that inpatient services furnished can reasonably be expected to improve the patient's condition.   Thedore MinsAkintayo, Jaelen Gellerman, MD 12/22/2013, 10:07 AM

## 2013-12-22 NOTE — [facility] Group Notes (Signed)
[facility] LCSW Group Therapy  12/22/2013  1:05 PM  Type of Therapy:  Group therapy  Participation Level:  Active  Participation Quality:  Attentive  Affect:  Flat  Cognitive:  Oriented  Insight:  Limited  Engagement in Therapy:  Limited  Modes of Intervention:  Discussion, Socialization  Summary of Progress/Problems:  Chaplain was here to lead a group on themes of hope and courage. "Care means to fix something.  Like when it is broken, you care for it by fixing it."  "I'm learning by being here that I am not in this by myself.  Others have some of the same struggles I do.  And feel the care here."  With prompting, pt was able to identify that the care comes from peers as much, if not more than, staff. "Going forward, I have to make the best decision for me.  I can't put others' care first.  I have to put my own care first."  He went on to share about mistakes he has made and hopefully learned from, and states he did this as his way showing care for his peers because he wants them to not make the same mistakes he has made.  Daryel Geraldorth, Tyrice Hewitt B 12/22/2013 12:30 PM

## 2013-12-22 NOTE — Progress Notes (Signed)
D: Patient denies SI/HI and visual hallucinations. The patient is positive for auditory hallucinations. The patient states "My thoughts are clearing up and I'm starting to feel like I'm myself again." The patient also states "The voices are getting quieter each day." The patient is attending groups and interacting appropriately within the milieu.   A: Patient given emotional support from RN. Patient encouraged to come to staff with concerns and/or questions. Patient's medication routine continued. Patient's orders and plan of care reviewed.   R: Patient remains appropriate and cooperative. Will continue to monitor patient q15 minutes for safety.

## 2013-12-23 NOTE — [facility] Group Notes (Signed)
[facility] Group Notes:  (Clinical Social Work)  12/23/2013  11:00-11:45AM  Summary of Progress/Problems:   The main focus of today's process group was for the patient to identify ways in which they have in the past sabotaged their own recovery and reasons they may have done this/what they received from doing it.  We then worked to identify a specific plan to avoid doing this when discharged from the hospital for this admission.  The patient expressed a lot of his thoughts throughout group, somewhat disconnected from the topic but pleasant and comprehensible.  He came back several times to the topic of his homelessness that he does not understand since he does work 40 hours weekly at [employer].  He said after his expenses of car insurance and gas, child support and food, he does not have money for rent.  He initially said he was planning to take his discharge medications on an "as needed" basis.  After CSW compared this to taking blood pressure meds PRN, he stated if they are needed, he will stay on them as prescribed.  He talked about a time he went to a shelter in [place] and the police came to protect him because the other residents did not like someone from out of state.  He was able to admit he has been behaving with paranoia recently prior to this hospitalization.  He does not think he wants to go to [place] [facility] [place] as he has been planning with his Child psychotherapistsocial worker, because he has heard "bad things" about it.  He thinks maybe he should go to [place].   Type of Therapy:  Group Therapy - Process  Participation Level:  Active  Participation Quality:  Attentive, Sharing and Supportive  Affect:  Appropriate  Cognitive:  Alert, Disorganized and Oriented  Insight:  Developing/Improving  Engagement in Therapy:  Engaged  Modes of Intervention:  Clarification, Education, Exploration, Discussion  Ambrose MantleMareida Grossman-Orr, LCSW 12/23/2013, 12:13 PM

## 2013-12-23 NOTE — Progress Notes (Signed)
Patient ID: Benjamin Butler, male   DOB: 1975/11/24, 38 y.o.   MRN: [id number] [facility] MD Progress Note  12/23/2013 2:22 PM AIREN STIEHL  MRN:  [id number] Subjective:  He reports better mood today. He admits safety concerns before admission.   Objective:     Diagnosis:   DSM5: Schizophrenia Disorders:  Delusional Disorder (297.1) and  Psychotic Disorder (298.8) Obsessive-Compulsive Disorders:   Trauma-Stressor Disorders:   Substance/Addictive Disorders:  Cannabis Use Disorder - Moderate 9304.30) Depressive Disorders:  Major Depressive Disorder - with Psychotic Features (296.24) Total Time spent with patient: 20 minutes  Axis I: Major depressive disorder, single episode, severe, specified as with psychotic behavior           Intermittent explosive disorder           Cannabis use disorder  Axis II: Deferred Axis III: History reviewed. No pertinent past medical history. Axis IV: other psychosocial or environmental problems, problems related to social environment and problems with primary support group  ADL's:  Intact  Sleep: Fair  Appetite:  Fair  Suicidal Ideation: yes Plan:  denies Intent:  denies Means:  denies Homicidal Ideation: yes Plan:  denies Intent:  denies Means:  denies AEB (as evidenced by):  Psychiatric Specialty Exam: Physical Exam  Psychiatric: His speech is normal and behavior is normal. Judgment normal. His mood appears anxious. Thought content is paranoid. Cognition and memory are normal. He exhibits a depressed mood.    Review of Systems  Constitutional: Negative.   HENT: Negative.   Eyes: Negative.   Respiratory: Negative.   Cardiovascular: Negative.   Gastrointestinal: Negative.   Genitourinary: Negative.   Musculoskeletal: Negative.   Skin: Negative.   Neurological: Negative.   Endo/Heme/Allergies: Negative.   Psychiatric/Behavioral: Positive for depression, hallucinations and substance abuse. The patient is nervous/anxious.     Blood pressure  143/86, pulse 106, temperature 98.1 F (36.7 C), temperature source Oral, resp. rate 16, height 6' 0.21" (1.834 m), weight 90.719 kg (200 lb).Body mass index is 26.97 kg/(m^2).  General Appearance: calm  Eye Contact::  Fair  Speech:  Clear and Coherent  Volume:  Normal  Mood:  Angry, Depressed and Irritable  Affect:  Labile and Tearful  Thought Process:  Coherent and Goal Directed  Orientation:  Full (Time, Place, and Person)  Thought Content:  deneis  Suicidal Thoughts:  deneis  Homicidal Thoughts:  deneis  Memory:  Immediate;   Fair Recent;   Fair Remote;   Fair  Judgement:  Poor  Insight:  Lacking  Psychomotor Activity:  Increased  Concentration:  Fair  Recall:  [employer] of Knowledge:Fair  Language: Good  Akathisia:  No  Handed:  Right  AIMS (if indicated):     Assets:  Communication Skills Desire for Improvement Physical Health Social Support  Sleep:  Number of Hours: 6.5   Musculoskeletal: Strength & Muscle Tone: within normal limits Gait & Station: normal Patient leans: N/A  Current Medications: Current Facility-Administered Medications  Medication Dose Route Frequency Provider Last Rate Last Dose  . acetaminophen (TYLENOL) tablet 650 mg  650 mg Oral Q6H PRN Kerry Hough, PA-C      . alum & mag hydroxide-simeth (MAALOX/MYLANTA) 200-200-20 MG/5ML suspension 30 mL  30 mL Oral Q4H PRN Kerry Hough, PA-C      . FLUoxetine (PROZAC) capsule 20 mg  20 mg Oral Daily Mojeed Akintayo   20 mg at 12/23/13 0752  . magnesium hydroxide (MILK OF MAGNESIA) suspension 30 mL  30  mL Oral Daily PRN Kerry Hough, PA-C      . nicotine polacrilex (NICORETTE) gum 2 mg  2 mg Oral PRN Kerry Hough, PA-C   2 mg at 12/23/13 1254  . QUEtiapine (SEROQUEL XR) 24 hr tablet 100 mg  100 mg Oral QHS Mojeed Akintayo   100 mg at 12/22/13 2214  . traZODone (DESYREL) tablet 50 mg  50 mg Oral QHS,MR X 1 Mojeed Akintayo   50 mg at 12/22/13 2305    Lab Results:  Results for orders placed  during the hospital encounter of 12/19/13 (from the past 48 hour(s))  GC/CHLAMYDIA PROBE AMP     Status: None   Collection Time    12/21/13  4:43 PM      Result Value Ref Range   CT Probe RNA NEGATIVE  NEGATIVE   GC Probe RNA NEGATIVE  NEGATIVE   Comment: (NOTE)                                                                                               **Normal Reference Range: Negative**          Assay performed using the Gen-Probe APTIMA COMBO2 (R) Assay.     Acceptable specimen types for this assay include APTIMA Swabs (Unisex,     endocervical, urethral, or vaginal), first void urine, and ThinPrep     liquid based cytology samples.     Performed at [facility]  HIV ANTIBODY (ROUTINE TESTING)     Status: None   Collection Time    12/21/13  7:56 PM      Result Value Ref Range   HIV NON REACTIVE  NON REACTIVE   Comment: Performed at [facility]  HSV(HERPES SIMPLEX VRS) I + II AB-IGG     Status: None   Collection Time    12/21/13  7:56 PM      Result Value Ref Range   HSV 1 Glycoprotein G Ab, IgG <0.10     Comment: (NOTE)       IV = Index Value                 < 0.90 IV              Negative                 0.90-1.10 IV           Equivocal                 > 1.10 IV              Positive   HSV 2 Glycoprotein G Ab, IgG <0.10     Comment: (NOTE)       IV = Index Value                 < 0.90 IV              Negative                 0.90-1.10 IV  Equivocal                 > 1.10 IV              Positive     Performed at [facility]  RPR     Status: None   Collection Time    12/21/13  7:56 PM      Result Value Ref Range   RPR NON REACTIVE  NON REACTIVE   Comment: Performed at [facility]    Physical Findings: AIMS: Facial and Oral Movements Muscles of Facial Expression: None, normal Lips and Perioral Area: None, normal Jaw: None, normal Tongue: None, normal,Extremity Movements Upper (arms, wrists, hands, fingers): None,  normal Lower (legs, knees, ankles, toes): None, normal, Trunk Movements Neck, shoulders, hips: None, normal, Overall Severity Severity of abnormal movements (highest score from questions above): None, normal Incapacitation due to abnormal movements: None, normal Patient's awareness of abnormal movements (rate only patient's report): No Awareness, Dental Status Current problems with teeth and/or dentures?: No Does patient usually wear dentures?: No  CIWA:    COWS:     Treatment Plan Summary: Daily contact with patient to assess and evaluate symptoms and progress in treatment Medication management  Plan:1. Admit for crisis management and stabilization. 2. Medication management to reduce current symptoms to base line and improve the  patient's overall level of functioning    Medical Decision Making Problem Points:  Established problem, improving (1), Review of last therapy session (1) and Review of psycho-social stressors (1) Data Points:  Order Aims Assessment (2) Review or order clinical lab tests (1) Review of medication regiment & side effects (2) Review of new medications or change in dosage (2)  I certify that inpatient services furnished can reasonably be expected to improve the patient's condition.   Wonda Ceriseasul, Luria Rosario, MD 12/23/2013, 2:22 PM

## 2013-12-23 NOTE — [facility] Group Notes (Signed)
[facility] Group Notes:  (Nursing/MHT/Case Management/Adjunct)  Date:  12/23/2013  Time: 0920  Type of Therapy:  Psychoeducational Skills--Healthy Coping skills  Participation Level:  Active  Participation Quality:  Redirectable  Affect:  Depressed  Cognitive:  Lacking  Insight:  Lacking  Engagement in Group:  Distracting and Off Topic  Modes of Intervention:  Discussion, Education and Exploration  Summary of Progress/Problems:Psychoeducational review of healthy coping skills. Reviewed unhealthy coping , with a focus on healthy coping skills in areas that have historically been problematic for the patient. Benjamin Butler states ''Anger and crowds really get me into trouble'' Patient was able to identify healthy coping - listening to music and going for a drive in place of unhealthy coping.    Benjamin Butler, Benjamin Butler 12/23/2013, 11:58 AM

## 2013-12-23 NOTE — Progress Notes (Signed)
Adult Psychoeducational Group Note  Date:  12/23/2013 Time:  8:38 PM  Group Topic/Focus:  Wrap-Up Group:   The focus of this group is to help patients review their daily goal of treatment and discuss progress on daily workbooks.  Participation Level:  None  Participation Quality:  Inattentive  Affect:  Defensive  Cognitive:  Lacking  Insight: None  Engagement in Group:  None  Modes of Intervention:  Discussion and Limit-setting  Additional Comments:The pt expressed that he was not able to talk in group .  Octavio Mannshigpen, Curby Carswell Lee 12/23/2013, 8:38 PM

## 2013-12-23 NOTE — Progress Notes (Signed)
Patient ID: [name], male   DOB: 06/16/1976, 38 y.o.   MRN: [id number] D. Patient presents with hypomanic mood, affect congruent. Patient thoughts/speech are somewhat tangential and circumstantial this morning. In early am patient talking about '' I am just worrying about my girlfriend, you know trying to get a hold of her. But you can't really trust what's going on '' Patient at times difficult to follow in conversation, but able to be redirected. He reports his mood is improved and completed self inventory rating depression at 4/10 on depression scale 10 being worst depression 1 being least. Patient also reported paranoia, requesting repeatedly to get his cell phone from locker stating '' you never know who you can trust. Are you sure there is a camera to make sure things won't get stolen'' Patient was redirected that all items locked up would remain so until discharge, patient then attempting to staff split asking other RN to get cell phone. A. Medications given as ordered. Support and encouragement provided. Discussed patients progress with Dr. Fredda Hammedazul. R. Patient in no acute distress at this time. No further voiced concerns at this time. Will continue to monitor q 15 minutes for safety.

## 2013-12-23 NOTE — [facility] Group Notes (Signed)
[facility] Group Notes:  (Nursing/MHT/Case Management/Adjunct)  Date:  12/23/2013  Time:  10:25 AM  Type of Therapy:  Psychoeducational Skills--Self Inventory Review  Participation Level:  Active  Participation Quality:  Appropriate and Sharing  Affect:  Depressed  Cognitive:  Lacking, tangential  Insight:  Lacking and Limited  Engagement in Group:  Distracting and Off Topic  Modes of Intervention:  Discussion, Education and Exploration  Summary of Progress/Problems: self inventory review with RN   Malva LimesStrader, Khaleelah Yowell 12/23/2013, 10:25 AM

## 2013-12-24 NOTE — [facility] Group Notes (Signed)
[facility] Group Notes:  (Clinical Social Work)  12/24/2013   11:15am-12:00pm  Summary of Progress/Problems:  The main focus of today's process group was to listen to a variety of genres of music and to identify that different types of music provoke different responses.  The patient then was able to identify personally what was soothing for them, as well as energizing.  Handouts were used to record feelings evoked, as well as how patient can personally use this knowledge in sleep habits, with depression, and with other symptoms.  The patient expressed understanding of concepts, as well as knowledge of how each type of music affected him and how this can be used at home as a wellness/recovery tool.  Type of Therapy:  Music Therapy   Participation Level:  Active  Participation Quality:  Attentive and Sharing  Affect:  Appropriate  Cognitive:  Oriented  Insight:  Engaged  Engagement in Therapy:  Engaged  Modes of Intervention:   Activity, Exploration  Ambrose MantleMareida Grossman-Orr, LCSW 12/24/2013, 12:30pm

## 2013-12-24 NOTE — [facility] Group Notes (Signed)
[facility] Group Notes:  (Nursing/MHT/Case Management/Adjunct)  Date:  12/24/2013  Time:  0900  Type of Therapy: Psychoeducational Skills--Self Inventory Review  Participation Level: Active  Participation Quality: Appropriate and Sharing  Affect: Depressed  Cognitive: Lacking, tangential  Insight: Lacking and Limited  Engagement in Group: Distracting and Off Topic  Modes of Intervention: Discussion, Education and Exploration  Summary of Progress/Problems: self inventory review with RN    Malva LimesStrader, Cliford Sequeira 12/24/2013, 2:55 PM

## 2013-12-24 NOTE — Progress Notes (Signed)
Patient ID: Benjamin Butler, male   DOB: 1976/07/13, 38 y.o.   MRN: [id number] [facility] MD Progress Note  12/24/2013 2:14 PM Benjamin Butler  MRN:  [id number] Subjective:  He has poor boundaries and very impulsive.  He is concerned about STD. He is upset due to having AH today.   Objective:     Diagnosis:   DSM5: Schizophrenia Disorders:  Delusional Disorder (297.1) and  Psychotic Disorder (298.8) Obsessive-Compulsive Disorders:   Trauma-Stressor Disorders:   Substance/Addictive Disorders:  Cannabis Use Disorder - Moderate 9304.30) Depressive Disorders:  Major Depressive Disorder - with Psychotic Features (296.24) Total Time spent with patient: 20 minutes  Axis I: Major depressive disorder, single episode, severe, specified as with psychotic behavior           Intermittent explosive disorder           Cannabis use disorder  Axis II: Deferred Axis III: History reviewed. No pertinent past medical history. Axis IV: other psychosocial or environmental problems, problems related to social environment and problems with primary support group  ADL's:  Intact  Sleep: Fair  Appetite:  Fair  Suicidal Ideation: yes Plan:  denies Intent:  denies Means:  denies Homicidal Ideation: yes Plan:  denies Intent:  denies Means:  denies AEB (as evidenced by):  Psychiatric Specialty Exam: Physical Exam  Psychiatric: His speech is normal and behavior is normal. Judgment normal. His mood appears anxious. Thought content is paranoid. Cognition and memory are normal. He exhibits a depressed mood.    Review of Systems  Constitutional: Negative.   HENT: Negative.   Eyes: Negative.   Respiratory: Negative.   Cardiovascular: Negative.   Gastrointestinal: Negative.   Genitourinary: Negative.   Musculoskeletal: Negative.   Skin: Negative.   Neurological: Negative.   Endo/Heme/Allergies: Negative.   Psychiatric/Behavioral: Positive for depression, hallucinations and substance abuse. The patient is  nervous/anxious.     Blood pressure 124/69, pulse 80, temperature 97.6 F (36.4 C), temperature source Oral, resp. rate 20, height 6' 0.21" (1.834 m), weight 90.719 kg (200 lb).Body mass index is 26.97 kg/(m^2).  General Appearance: calm  Eye Contact::  Fair  Speech:  Clear and Coherent  Volume:  Normal  Mood:  Angry, Depressed and Irritable  Affect:  Labile and Tearful  Thought Process:  Coherent and Goal Directed  Orientation:  Full (Time, Place, and Person)  Thought Content:  Reports AH today  Suicidal Thoughts:  deneis  Homicidal Thoughts:  deneis  Memory:  Immediate;   Fair Recent;   Fair Remote;   Fair  Judgement:  Poor  Insight:  Lacking  Psychomotor Activity:  Increased  Concentration:  Fair  Recall:  [employer] of Knowledge:Fair  Language: Good  Akathisia:  No  Handed:  Right  AIMS (if indicated):     Assets:  Communication Skills Desire for Improvement Physical Health Social Support  Sleep:  Number of Hours: 6.25   Musculoskeletal: Strength & Muscle Tone: within normal limits Gait & Station: normal Patient leans: N/A  Current Medications: Current Facility-Administered Medications  Medication Dose Route Frequency Provider Last Rate Last Dose  . acetaminophen (TYLENOL) tablet 650 mg  650 mg Oral Q6H PRN Kerry Hough, PA-C      . alum & mag hydroxide-simeth (MAALOX/MYLANTA) 200-200-20 MG/5ML suspension 30 mL  30 mL Oral Q4H PRN Kerry Hough, PA-C      . FLUoxetine (PROZAC) capsule 20 mg  20 mg Oral Daily Mojeed Akintayo   20 mg at 12/24/13 0753  .  magnesium hydroxide (MILK OF MAGNESIA) suspension 30 mL  30 mL Oral Daily PRN Kerry HoughSpencer E Simon, PA-C      . nicotine polacrilex (NICORETTE) gum 2 mg  2 mg Oral PRN Kerry HoughSpencer E Simon, PA-C   2 mg at 12/24/13 1351  . QUEtiapine (SEROQUEL XR) 24 hr tablet 100 mg  100 mg Oral QHS Mojeed Akintayo   100 mg at 12/23/13 2225  . traZODone (DESYREL) tablet 50 mg  50 mg Oral QHS,MR X 1 Mojeed Akintayo   50 mg at 12/23/13 2225     Lab Results:  No results found for this or any previous visit (from the past 48 hour(s)).  Physical Findings: AIMS: Facial and Oral Movements Muscles of Facial Expression: None, normal Lips and Perioral Area: None, normal Jaw: None, normal Tongue: None, normal,Extremity Movements Upper (arms, wrists, hands, fingers): None, normal Lower (legs, knees, ankles, toes): None, normal, Trunk Movements Neck, shoulders, hips: None, normal, Overall Severity Severity of abnormal movements (highest score from questions above): None, normal Incapacitation due to abnormal movements: None, normal Patient's awareness of abnormal movements (rate only patient's report): No Awareness, Dental Status Current problems with teeth and/or dentures?: No Does patient usually wear dentures?: No  CIWA:    COWS:     Treatment Plan Summary: Daily contact with patient to assess and evaluate symptoms and progress in treatment Medication management  Plan:1. Admit for crisis management and stabilization. 2. Medication management to reduce current symptoms to base line and improve the  patient's overall level of functioning His labs were discussed with him today    Medical Decision Making Problem Points:  Established problem, improving (1), Review of last therapy session (1) and Review of psycho-social stressors (1) Data Points:  Order Aims Assessment (2) Review or order clinical lab tests (1) Review of medication regiment & side effects (2) Review of new medications or change in dosage (2)  I certify that inpatient services furnished can reasonably be expected to improve the patient's condition.   Wonda Ceriseasul, Graham Hyun, MD 12/24/2013, 2:14 PM

## 2013-12-24 NOTE — Progress Notes (Addendum)
Patient ID: Benjamin Butler, male   DOB: 07/31/1976, 38 y.o.   MRN: [id number] D- Patient reports he slept fair and his appetite is improving.  His energy level is normal and his ability to pay attention is improving.  He is rating his depression 7/10 .  He is hearing voices and says he prays to try to get voices to go away.  Says he was sitting [place] style on the floor last night and praying and a staff member might have reported "that I was acting crazy, but that is how I pray sometimes.  A- Supported patient.  Gave him a notebook to write down things he is grateful for.  R- He says he doesn't write very well but can put down one or two words to help him trigger  the positive self talk he wants to focus on.  Says he likes to think of things to build himself up.  Did a good good of setting boundaries with a peer who came up too close behind him unexpectedly.  Asked for space and explained how it could be dangerous to surprise someone like that.    Also talked with patient about continuing smoking cessation after discharge.  Patient asking about cost of nicorette gum.

## 2013-12-24 NOTE — [facility] Group Notes (Signed)
[facility] Group Notes:  (Nursing/MHT/Case Management/Adjunct)  Date:  12/24/2013  Time:  0920  Type of Therapy: Psychoeducational Skills--Healthy Coping skills  Participation Level: Active  Participation Quality: Redirectable  Affect: tangential Cognitive: Lacking  Insight: Lacking  Engagement in Group: Distracting and Off Topic  Modes of Intervention: Discussion, Education and Exploration     Malva LimesStrader, Donat Humble 12/24/2013, 2:55 PM

## 2013-12-24 NOTE — Progress Notes (Signed)
Patient ID: Benjamin Butler, male   DOB: 08-Apr-1976, 38 y.o.   MRN: [id number] D: Patient  presented with depressed mood and flat affect.  Pt denies SI/HI/VH and pain. Pt endorses auditory hallucination without command. Pt states the voices are like a "whisper". Pt is observed in dayroom interacting with peers. Pt denies any needs or concerns.  Cooperative with assessment. No acute distressed noted at this time.   A: Met with pt 1:1. Medications administered as prescribed. Writer encouraged pt to discuss feelings. Pt encouraged to come to staff with any question or concerns.   R: Patient remains safe. He is complaint with medications and denies any adverse reaction. Continue current POC.

## 2013-12-24 NOTE — Progress Notes (Signed)
Patient ID: Lorna DibbleScott X Wyse, male   DOB: 05/17/1976, 38 y.o.   MRN: [id number] D)  Has spent most of the evening in the dayroom, attended group.  Has been pleasant and cooperative, was seen laughing with male peer and has interacted appropriately with staff tonight.  Stated has heard voices, but currently denies.  Was compliant with hs meds, watched tv before going to bed. A)  Will continue to monitor for safety, continue POC R)  Safety maintained.

## 2013-12-24 NOTE — Progress Notes (Signed)
Adult Psychoeducational Group Note  Date:  12/24/2013 Time:  9:53 PM  Group Topic/Focus:  Wrap-Up Group:   The focus of this group is to help patients review their daily goal of treatment and discuss progress on daily workbooks.  Participation Level:  Active  Participation Quality:  Appropriate  Affect:  Labile  Cognitive:  Lacking  Insight: Limited  Engagement in Group:  Engaged  Modes of Intervention:  Education, Exploration, Socialization and Support  Additional Comments:  Patient attended and participated in group tonight. He reports having a good day. He went to his meals and attended his groups.  He  learnt about the power of music and how it affect people. Patient advised that he talked to family members today. His support system is his work. 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   Lita MainsFrancis, Mannix Kroeker [facility] 12/24/2013, 9:53 PM

## 2013-12-25 MED ORDER — FLUOXETINE HCL 20 MG PO CAPS
40.0000 mg | ORAL_CAPSULE | Freq: Every day | ORAL | Status: DC
  Administered 2013-12-26: 40 mg via ORAL
  Filled 2013-12-25 (×2): qty 2
  Filled 2013-12-25: qty 28

## 2013-12-25 MED ORDER — TRAZODONE HCL 50 MG PO TABS
50.0000 mg | ORAL_TABLET | Freq: Every day | ORAL | Status: DC
  Administered 2013-12-25: 50 mg via ORAL
  Filled 2013-12-25: qty 14
  Filled 2013-12-25 (×2): qty 1

## 2013-12-25 MED ORDER — QUETIAPINE FUMARATE 100 MG PO TABS
100.0000 mg | ORAL_TABLET | Freq: Every day | ORAL | Status: DC
  Administered 2013-12-25: 100 mg via ORAL
  Filled 2013-12-25: qty 14
  Filled 2013-12-25 (×2): qty 1

## 2013-12-25 MED ORDER — QUETIAPINE FUMARATE ER 200 MG PO TB24: 200.0000 mg | ORAL_TABLET | Freq: Every day | ORAL | Status: DC

## 2013-12-25 NOTE — Tx Team (Signed)
  Interdisciplinary Treatment Plan Update   Date Reviewed:  12/25/2013  Time Reviewed:  3:18 PM  Progress in Treatment:   Attending groups: Yes Participating in groups: Yes Taking medication as prescribed: Yes Tolerating medication: Yes Family/Significant other contact made: No, patient refused Patient understands diagnosis: Yes AEB asking for help with voices, anger managment Discussing patient identified problems/goals with staff: Yes  See initial care plan Medical problems stabilized or resolved: Yes Denies suicidal/homicidal ideation: yes Patient has not harmed self or others: Yes  For review of initial/current patient goals, please see plan of care.  Estimated Length of Stay:  3/3 planning for DC  Reason for Continuation of Hospitalization: Depression Hallucinations Medication stabilization  New Problems/Goals identified:  N/A  Discharge Plan or Barriers:   [name] [facility].  He is also actively seeking a half way house as he has refused the [facility]  Additional Comments:  NA   Attendees:  Signature: Thedore MinsMojeed Akintayo, MD 12/25/2013 3:18 PM   Signature: Mordecai RasmussenHannah Alka Falwell, LCSW 12/25/2013 3:18 PM  Signature: Fransisca KaufmannLaura Davis, NP 12/25/2013 3:18 PM  Signature: Joslyn Devonaroline Beaudry, RN 12/25/2013 3:18 PM  Signature: Liborio NixonPatrice White, RN 12/25/2013 3:18 PM  Signature:  12/25/2013 3:18 PM  Signature:   12/25/2013 3:18 PM  Signature:    Signature:    Signature:    Signature:    Signature:    Signature:      Scribe for Treatment Team:   Patterson HammersmithHannah, LCSW  12/25/2013 3:18 PM

## 2013-12-25 NOTE — [facility] Group Notes (Signed)
[facility] LCSW Group Therapy  12/25/2013 2:49 PM  Type of Therapy:  Group Therapy  Participation Level:  Active  Participation Quality:  Attentive, Sharing and Supportive  Affect:  Appropriate  Cognitive:  Alert and Oriented  Insight:  Developing/Improving  Engagement in Therapy:  Engaged  Modes of Intervention:  Discussion, Exploration and Support  Summary of Progress/Problems:  Group today consisted of the topic of obstacles.  Group discussed how to overcome problems, why problems persist and how to overcome problems in which group facilitated discussion.  Benjamin Butler was involved in the topic of his personal obstacles being marijuana. He was hyper fixated on how marijuana allows him to be a new person, think differently, but able to understand the negative affects of the drug.  He reports this will be difficult for him to overcome as this is strongly rooted in his family with mother, father, and grandparents.  [name] reports he wants ultimately to be to himself, more low key with no one bothering him, and able to have a home for himself.  He was able to process with the rest of group of ways of overcoming problems and how he creates problems that cause negative outcomes.  Benjamin Butler, Benjamin Butler 12/25/2013, 2:49 PM

## 2013-12-25 NOTE — [facility] Group Notes (Signed)
[facility]   12/25/2013 10:01 AM  Participation Quality:  Active and Engaged  Mood/Affect:  Appropriate  Depression Rating:  3  Anxiety Rating:  5 (due to unknown where he will live)  Thoughts of Suicide:  No Will you contract for safety?   Yes  Current AVH:  No  Plan for Discharge/Comments:  [name] remains unclear regarding his discharge plans. He shares he will work and is not afraid of working, but needs consistent housing. LCSW and patient discussed the option of a [facility] in which he was interested in, but after speaking to another patient he declined. He now [place] live in [place].  LCSW will work with patient regarding DC to shelter in efforts to continue his aftercare here locally.  Transportation Means:reports he has his own car.  (may need a bus ticket)  Supports: did not disclose at this time.  Raye Sorrowoble, Benjamin Butler N

## 2013-12-25 NOTE — Progress Notes (Signed)
Patient ID: Lorna DibbleScott X Rikard, male   DOB: 06/22/1976, 38 y.o.   MRN: [id number] D: pt. In dayroom, smiling interacting, reports today "been upset off and on" rated depression at "6" of 10. Pt. Denies SHI, "not right now"  Pt. Reports how he will be feeling is based on a call he is about to make. Pt. Reports "I'll be going home tomorrow so we gone turn up tonight, but we not going to be to bad" A: Writer introduced self to client and encouraged him be appropriate. Writer reviewed medication administration schedule. Staff will monitor q3315min for safety. R: Pt. Is safe on the unit. Pt. Attended group.

## 2013-12-25 NOTE — Progress Notes (Signed)
Adult Psychoeducational Group Note  Date:  12/25/2013 Time:  10:18 PM  Group Topic/Focus:  Wrap-Up Group:   The focus of this group is to help patients review their daily goal of treatment and discuss progress on daily workbooks.  Participation Level:  Active  Participation Quality:  Appropriate  Affect:  Appropriate  Cognitive:  Appropriate  Insight: Lacking  Engagement in Group:  Engaged  Modes of Intervention:  Discussion  Additional Comments:  Pt stated he has learned that sometimes it is better to keep to himself. Pt stated that his goals are to spend more money on himself and nobody else. He stated that he only cares about himself.  Margorie Johnege, Caitlin Ainley 12/25/2013, 10:18 PM

## 2013-12-25 NOTE — Progress Notes (Signed)
Patient ID: Benjamin Butler, male   DOB: 07/04/1976, 38 y.o.   MRN: [id number] D)  Has been brighter this evening, loud at times, interacting with select peers, laughing and talking.  Spent most of the evening in the dayroom, attended group and participated, compliant with meds..  Requested nicorette gum this evening and stated he was surprised how much it seemed to help him with his anxiety.  Stated has had a pretty good day overall, and is feeling better.   A)  Will continue to monitor for safety , continue POC R)  Safety maintained.

## 2013-12-25 NOTE — Progress Notes (Signed)
Patient ID: Benjamin Butler, male   DOB: 07/12/1976, 38 y.o.   MRN: [id number] D: patient reports increased depressive symptoms today and poor sleep.  He rates his depression as an 8; hopelessness as a 10.  Patient's speech is rapid and pressured at times.  Patient stated, "I have a girl in [place] that is going to write a book about me."  Patient stated that he and his girlfriend "need to break up, but our hearts are not it."  He states that he is going to get back in the dating game.  Patient is a probable discharge for tomorrow.  He denies HI/AVH/SI.  A: continue to monitor medication management and MD orders.  Safety checks completed every 15 minutes per protocol.  R: patient is receptive to staff; his behavior is appropriate.

## 2013-12-25 NOTE — Progress Notes (Signed)
Patient ID: Benjamin Butler, male   DOB: [birth year], 38 y.o.   MRN: [id number] [facility] MD Progress Note  12/25/2013 10:40 AM KAILEN HINKLE  MRN:  [id number] Subjective: " I got more depressed yesterday and could not sleep las night.'' Objective: Patient reports increased depressive symptoms but endorsed decreased mood lability, racing thoughts and anxiety. He denies auditory/visual hallucinations, suicidal or homicidal thoughts. Patient has been attending the unit milieu and compliant with his medications. His blood reports was reviewed and showed negative HIV/RPR and Herpes.  Diagnosis:   DSM5: Schizophrenia Disorders:  Delusional Disorder (297.1) and  Psychotic Disorder (298.8) Obsessive-Compulsive Disorders:   Trauma-Stressor Disorders:   Substance/Addictive Disorders:  Cannabis Use Disorder - Moderate 9304.30) Depressive Disorders:  Major Depressive Disorder - with Psychotic Features (296.24) Total Time spent with patient: 20 minutes  Axis I: Major depressive disorder, single episode, severe, specified as with psychotic behavior           Intermittent explosive disorder           Cannabis use disorder  Axis II: Deferred Axis III: History reviewed. No pertinent past medical history. Axis IV: other psychosocial or environmental problems, problems related to social environment and problems with primary support group  ADL's:  Intact  Sleep: Fair  Appetite:  Fair  Suicidal Ideation: yes Plan:  denies Intent:  denies Means:  denies Homicidal Ideation: yes Plan:  denies Intent:  denies Means:  denies AEB (as evidenced by):  Psychiatric Specialty Exam: Physical Exam  Psychiatric: His speech is normal and behavior is normal. Judgment normal. His mood appears anxious. Cognition and memory are normal. He exhibits a depressed mood.    Review of Systems  Constitutional: Negative.   HENT: Negative.   Eyes: Negative.   Respiratory: Negative.   Cardiovascular: Negative.   Gastrointestinal:  Negative.   Genitourinary: Negative.   Musculoskeletal: Negative.   Skin: Negative.   Neurological: Negative.   Endo/Heme/Allergies: Negative.   Psychiatric/Behavioral: Positive for depression. The patient is nervous/anxious.     Blood pressure 128/85, pulse 87, temperature 97.5 F (36.4 C), temperature source Oral, resp. rate 20, height 6' 0.21" (1.834 m), weight 90.719 kg (200 lb).Body mass index is 26.97 kg/(m^2).  General Appearance: fairly groomed  Patent attorney::  Fair  Speech:  Clear and Coherent  Volume:  Normal  Mood: Depressed   Affect:  Labile and Tearful  Thought Process:  Coherent and Goal Directed  Orientation:  Full (Time, Place, and Person)  Thought Content:  Denies  Suicidal Thoughts: denies  Homicidal Thoughts:  denies  Memory:  Immediate;   Fair Recent;   Fair Remote;   Fair  Judgement:  fair  Insight:  marginal  Psychomotor Activity:  normal  Concentration:  Fair  Recall:  [employer] of Knowledge:Fair  Language: Good  Akathisia:  No  Handed:  Right  AIMS (if indicated):     Assets:  Communication Skills Desire for Improvement Physical Health Social Support  Sleep:  Number of Hours: 5.75   Musculoskeletal: Strength & Muscle Tone: within normal limits Gait & Station: normal Patient leans: N/A  Current Medications: Current Facility-Administered Medications  Medication Dose Route Frequency Provider Last Rate Last Dose  . acetaminophen (TYLENOL) tablet 650 mg  650 mg Oral Q6H PRN Kerry Hough, PA-C      . alum & mag hydroxide-simeth (MAALOX/MYLANTA) 200-200-20 MG/5ML suspension 30 mL  30 mL Oral Q4H PRN Kerry Hough, PA-C      . Melene Muller ON  12/26/2013] FLUoxetine (PROZAC) capsule 40 mg  40 mg Oral Daily Burnell Matlin      . magnesium hydroxide (MILK OF MAGNESIA) suspension 30 mL  30 mL Oral Daily PRN Kerry HoughSpencer E Simon, PA-C      . nicotine polacrilex (NICORETTE) gum 2 mg  2 mg Oral PRN Kerry HoughSpencer E Simon, PA-C   2 mg at 12/25/13 0808  . QUEtiapine  (SEROQUEL) tablet 100 mg  100 mg Oral QHS Dwon Sky      . traZODone (DESYREL) tablet 50 mg  50 mg Oral QHS Graziella Connery        Lab Results:  No results found for this or any previous visit (from the past 48 hour(s)).  Physical Findings: AIMS: Facial and Oral Movements Muscles of Facial Expression: None, normal Lips and Perioral Area: None, normal Jaw: None, normal Tongue: None, normal,Extremity Movements Upper (arms, wrists, hands, fingers): None, normal Lower (legs, knees, ankles, toes): None, normal, Trunk Movements Neck, shoulders, hips: None, normal, Overall Severity Severity of abnormal movements (highest score from questions above): None, normal Incapacitation due to abnormal movements: None, normal Patient's awareness of abnormal movements (rate only patient's report): No Awareness, Dental Status Current problems with teeth and/or dentures?: No Does patient usually wear dentures?: No  CIWA:    COWS:     Treatment Plan Summary: Daily contact with patient to assess and evaluate symptoms and progress in treatment Medication management  Plan:1. Admit for crisis management and stabilization. 2. Medication management to reduce current symptoms to base line and improve the  patient's overall level of functioning: Will increase Prozac to  40mg  daily for depression, continue  Seroquel to 100mg  qhs for insomnia and psychosis 3. Treat health problems as indicated. 4. Develop treatment plan to decrease risk of relapse upon discharge and the need for  readmission. 5. Psycho-social education regarding relapse prevention and self care. 6. Health care follow up as needed for medical problems.   Medical Decision Making Problem Points:  Established problem, improving (1), Review of last therapy session (1) and Review of psycho-social stressors (1) Data Points:  Order Aims Assessment (2) Review or order clinical lab tests (1) Review of medication regiment & side effects  (2) Review of new medications or change in dosage (2)  I certify that inpatient services furnished can reasonably be expected to improve the patient's condition.   Thedore MinsAkintayo, Lenna Hagarty, MD 12/25/2013, 10:40 AM

## 2013-12-26 MED ORDER — QUETIAPINE FUMARATE 100 MG PO TABS: 100.0000 mg | ORAL_TABLET | Freq: Every day | ORAL | Status: DC

## 2013-12-26 MED ORDER — FLUOXETINE HCL 40 MG PO CAPS: 40.0000 mg | ORAL_CAPSULE | Freq: Every day | ORAL | Status: DC

## 2013-12-26 MED ORDER — TRAZODONE HCL 50 MG PO TABS: 50.0000 mg | ORAL_TABLET | Freq: Every day | ORAL | Status: DC

## 2013-12-26 NOTE — [facility] Group Notes (Signed)
[facility] Group Notes:  (Nursing/MHT/Case Management/Adjunct)  Date:  12/26/2013  Time:  0900 am  Type of Therapy:  Psychoeducational Skills  Participation Level:  Active  Participation Quality:  Appropriate  Affect:  Appropriate  Cognitive:  Alert and Appropriate  Insight:  Appropriate  Engagement in Group:  Supportive  Modes of Intervention:  Socialization and Support  Summary of Progress/Problems:  Benjamin Butler, Benjamin Butler 12/26/2013, 9:54 AM

## 2013-12-26 NOTE — [facility] Suicide Risk Assessment (Signed)
[facility] INPATIENT:  Family/Significant Other Suicide Prevention Education  Suicide Prevention Education:  Patient Refusal for Family/Significant Other Suicide Prevention Education: The patient Benjamin Butler has refused to provide written consent for family/significant other to be provided Family/Significant Other Suicide Prevention Education during admission and/or prior to discharge.  Physician notified.  Raye SorrowCoble, Tahj Njoku N 12/26/2013, 8:52 AM

## 2013-12-26 NOTE — Discharge Summary (Signed)
Physician Discharge Summary Note  Benjamin Butler:  Benjamin Butler is an 38 y.o., male MRN:  [id number] DOB:  1976-03-24 Benjamin Butler phone:  [phone] (home)  Benjamin Butler address:   [address] [place] [address],  Total Time spent with Benjamin Butler: 20 minutes  Date of Admission:  12/19/2013 Date of Discharge: 12/26/13  Butler for Admission: Depression, Homicidal Ideation   Discharge Diagnoses: Principal Problem:   Major depressive disorder, single episode, severe, specified as with psychotic behavior Active Problems:   Intermittent explosive disorder   Schizoaffective disorder   Cannabis abuse   Psychiatric Specialty Exam: Physical Exam  Psychiatric: He has a normal mood and affect. His speech is normal and behavior is normal. Judgment and thought content normal. Cognition and memory are normal.    Review of Systems  Constitutional: Negative.   HENT: Negative.   Eyes: Negative.   Respiratory: Negative.   Cardiovascular: Negative.   Gastrointestinal: Negative.   Genitourinary: Negative.   Musculoskeletal: Negative.   Skin: Negative.   Neurological: Negative.   Endo/Heme/Allergies: Negative.   Psychiatric/Behavioral: Negative for depression, suicidal ideas, hallucinations, memory loss and substance abuse. The Benjamin Butler is not nervous/anxious and does not have insomnia.     Blood pressure 131/78, pulse 95, temperature 97.6 F (36.4 C), temperature source Oral, resp. rate 20, height 6' 0.21" (1.834 m), weight 90.719 kg (200 lb).Body mass index is 26.97 kg/(m^2).  General Appearance: Fairly Groomed  Patent attorney::  Good  Speech:  Clear and Coherent and Normal Rate  Volume:  Normal  Mood:  Euthymic  Affect:  Appropriate  Thought Process:  Goal Directed and Linear  Orientation:  Full (Time, Place, and Person)  Thought Content:  Negative  Suicidal Thoughts:  No  Homicidal Thoughts:  No  Memory:  Immediate;   Fair Recent;   Fair Remote;   Fair  Judgement:  Fair  Insight:  Fair   Psychomotor Activity:  Normal  Concentration:  Fair  Recall:  [employer] of Knowledge:Fair  Language: Fair  Akathisia:  No  Handed:  Right  AIMS (if indicated):     Assets:  Communication Skills Desire for Improvement Physical Health Social Support  Sleep:  Number of Hours: 5.25    Past Psychiatric History: No Diagnosis: Depression   Hospitalizations: Denies  Outpatient Care: Denies  Substance Abuse Care: Denies  Self-Mutilation: Denies  Suicidal Attempts: Denies  Violent Behaviors: Potential when angry    Musculoskeletal: Strength & Muscle Tone: within normal limits Gait & Station: normal Benjamin Butler leans: N/A  DSM5:  AXIS I: Major depressive disorder, single episode, severe, specified as with psychotic behavior  Intermittent explosive disorder  Cannabis use disorder  AXIS II: Deferred  AXIS III: History reviewed. No pertinent past medical history.  AXIS IV: occupational problems, other psychosocial or environmental problems, problems related to social environment and problems with primary support group  AXIS V: 61-70 mild symptoms   Level of Care:  OP  Hospital Course:  Benjamin Butler is a 38 y.o. male who presents to the Emergency Department voluntarily requesting medical clearance for suicidal and homicidal ideations. Benjamin Butler reported increase in depressive symptoms over the last several months that began to interfere with his ability to function, especially at work. Benjamin Butler reported having serious relationship problems with his long term girlfriend which led him to have suicidal thoughts with no plan and to hold a gun to his girlfriend's head. Benjamin Butler states today during his psychiatric admission assessment "I have been depressed for several months. I've been  in the process of splitting up from her. Her attitude changed after her father died one year ago. She start getting real smart with me and cussing. I'm the kind of person who can ignore a lot but then it builds up. My  girlfriend has been drinking and going out of town. All of this is recent changes. She started not texting back for hours. My father has also been having some medical problems. I just started having bad thoughts. At one point I even thought about shooting my girlfriend and her mother so they could go be with the father. That's not right that she just tried to cut me off after being together so long. I love to be respected. I think I called on the voices. I like to slow down certain music to hear the demons. I can call spirits. I know there are demons in the music. I don't think you all really understand me. I am an odd person so many don't really get me. I understand that. I really don't trust medicine. So I'm not sure about that. I really need you to help me get my job back. 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 The Benjamin Butler had not been taking any medication for his mental health prior to admission. He was started on Prozac for depression and Seroquel for psychosis/insomnia. Benjamin Butler was started on Trazodone 50 mg at hs for insomnia. He responded to his medications reporting decreased mood lability, racing thoughts, and anxiety. He reported having decreased auditory hallucinations after being started on Seroquel. Benjamin Butler reported hearing whispers. Benjamin Butler requested to have STD testing, which was negative. He spoke with his girlfriend during his stay no longer expressing homicidal ideation anymore. [name] recognized that he had let his angry emotions rise to unhealthy levels prior to admission. The Benjamin Butler attended groups and interacted well with peers. He reported decreasing levels of depression and hopelessness. Benjamin Butler was compliant with his medications. He was found stable for discharge. [name] denied any thoughts of suicide and homicide. Benjamin Butler was provided with prescriptions and sample medications at time of  discharge.   Consults:  psychiatry  Significant Diagnostic Studies:  Admission labs reviewed, STD testing per Benjamin Butler request negative   Discharge Vitals:   Blood pressure 131/78, pulse 95, temperature 97.6 F (36.4 C), temperature source Oral, resp. rate 20, height 6' 0.21" (1.834 m), weight 90.719 kg (200 lb). Body mass index is 26.97 kg/(m^2). Lab Results:   No results found for this or any previous visit (from the past 72 hour(s)).  Physical Findings: AIMS: Facial and Oral Movements Muscles of Facial Expression: None, normal Lips and Perioral Area: None, normal Jaw: None, normal Tongue: None, normal,Extremity Movements Upper (arms, wrists, hands, fingers): None, normal Lower (legs, knees, ankles, toes): None, normal, Trunk Movements Neck, shoulders, hips: None, normal, Overall Severity Severity of abnormal movements (highest score from questions above): None, normal Incapacitation due to abnormal movements: None, normal Benjamin Butler's awareness of abnormal movements (rate only Benjamin Butler's report): No Awareness, Dental Status Current problems with teeth and/or dentures?: No Does Benjamin Butler usually wear dentures?: No  CIWA:    COWS:     Psychiatric Specialty Exam: See Psychiatric Specialty Exam and Suicide Risk Assessment completed by Attending Physician prior to discharge.  Discharge destination:  Home  Is Benjamin Butler on multiple antipsychotic therapies at discharge:  No   Has Benjamin Butler had three or more failed trials of  antipsychotic monotherapy by history:  No  Recommended Plan for Multiple Antipsychotic Therapies: NA     Medication List       Indication   FLUoxetine 40 MG capsule  Commonly known as:  PROZAC  Take 1 capsule (40 mg total) by mouth daily.   Indication:  Depression     QUEtiapine 100 MG tablet  Commonly known as:  SEROQUEL  Take 1 tablet (100 mg total) by mouth at bedtime.   Indication:  Trouble Sleeping, depression     traZODone 50 MG tablet  Commonly  known as:  DESYREL  Take 1 tablet (50 mg total) by mouth at bedtime.   Indication:  Trouble Sleeping       Follow-up Information   Follow up with [facility] On 01/02/2014. (Appointment for Tuesday at 3:00pm (therapy) )    Contact information:   [address] [place], [place] [address] [phone]      Follow up with [facility] On 12/26/2013. (Please follow up at [facility] for medicaitons. You can walk in at any time M-F 8:00am-3:30pm.  Please get to [facility] as early as possible.)    Contact information:   [address] [place], [place] [address] [phone]      Follow-up recommendations:   Activity: as tolreated  Diet: healthy  Tests: routine  Other: Benjamin Butler to keep his after care appointment   Comments:  Take all your medications as prescribed by your mental healthcare provider.  Report any adverse effects and or reactions from your medicines to your outpatient provider promptly.  Benjamin Butler is instructed and cautioned to not engage in alcohol and or illegal drug use while on prescription medicines.  In the event of worsening symptoms, Benjamin Butler is instructed to call the crisis hotline, 911 and or go to the nearest ED for appropriate evaluation and treatment of symptoms.  Follow-up with your primary care provider for your other medical issues, concerns and or health care needs.   Total Discharge Time:  Greater than 30 minutes.  SignedFransisca Kaufmann NP-C 12/26/2013, 11:48 AM  Benjamin Butler seen, evaluated and I agree with notes by Nurse Practitioner. Thedore Mins, MD

## 2013-12-26 NOTE — Progress Notes (Signed)
[facility]charge [facility] :  Will you be returning to the same living situation after discharge: No. patient does not want to return wit his long term girlfriend as they have been having problems. At discharge, do you have transportation home?:Yes,  patient own car Do you have the ability to pay for your medications:Yes,  no barriers.  Release of information consent forms completed and in the chart;  Patient's signature needed at discharge.  Patient to Follow up at: Follow-up Information   Follow up with [facility] On 01/02/2014. (Appointment for Tuesday at 3:00pm (therapy) )    Contact information:   [address] [place], [place] [address] [phone]      [facility] On 12/26/2013. ([facility]. You can walk in at any time M-F 8:00am-3:30pm.  [facility] as [facility].)    Contact information:   [address] [place], [place] [address] [phone]      Patient denies SI/HI:   Yes,  no reports of SI    Safety Planning and Suicide Prevention discussed:  Yes,  only with patient. See SI note.  [name] was given multiple options for housing including a referral to [facility] in which he refused at this time.  LCSW and patient worked together with a [facility] [place].  [name] has an interview on Thursday with 2 half way houses in which he reports he will follow up.  LCSW offered shelter again, but patient refused, reporting he would sleep in his car and await his interview. [name] is aware of resources for [facility], shelters, and the option of returning with girlfriend, but reports he does not want too.  All resources given.   Raye SorrowCoble, Cyla Haluska N 12/26/2013, 10:08 AM

## 2013-12-26 NOTE — [facility] Suicide Risk Assessment (Signed)
   Demographic Factors:  Male and Low socioeconomic status  Total Time spent with patient: 20 minutes  Psychiatric Specialty Exam: Physical Exam  Psychiatric: He has a normal mood and affect. His speech is normal and behavior is normal. Judgment and thought content normal. Cognition and memory are normal.    Review of Systems  Constitutional: Negative.   HENT: Negative.   Eyes: Negative.   Respiratory: Negative.   Cardiovascular: Negative.   Gastrointestinal: Negative.   Genitourinary: Negative.   Musculoskeletal: Negative.   Skin: Negative.   Neurological: Negative.   Endo/Heme/Allergies: Negative.   Psychiatric/Behavioral: Negative.     Blood pressure 131/78, pulse 95, temperature 97.6 F (36.4 C), temperature source Oral, resp. rate 20, height 6' 0.21" (1.834 m), weight 90.719 kg (200 lb).Body mass index is 26.97 kg/(m^2).  General Appearance: Fairly Groomed  Patent attorneyye Contact::  Good  Speech:  Clear and Coherent and Normal Rate  Volume:  Normal  Mood:  Euthymic  Affect:  Appropriate  Thought Process:  Goal Directed and Linear  Orientation:  Full (Time, Place, and Person)  Thought Content:  Negative  Suicidal Thoughts:  No  Homicidal Thoughts:  No  Memory:  Immediate;   Fair Recent;   Fair Remote;   Fair  Judgement:  Fair  Insight:  Fair  Psychomotor Activity:  Normal  Concentration:  Fair  Recall:  [employer]  Fund of Knowledge:Fair  Language: Fair  Akathisia:  No  Handed:  Right  AIMS (if indicated):     Assets:  Communication Skills Desire for Improvement Physical Health Social Support  Sleep:  Number of Hours: 5.25    Musculoskeletal: Strength & Muscle Tone: within normal limits Gait & Station: normal Patient leans: N/A   Mental Status Per Nursing Assessment::   On Admission:  Thoughts of violence towards others;Self-harm thoughts  Current Mental Status by Physician: patient denies suicidal ideation, intent or plan  Loss Factors: Financial  problems/change in socioeconomic status  Historical Factors: Impulsivity  Risk Reduction Factors:   Sense of responsibility to family, Living with another person, especially a relative and Positive social support  Continued Clinical Symptoms:  Alcohol/Substance Abuse/Dependencies  Cognitive Features That Contribute To Risk:  Closed-mindedness    Suicide Risk:  Minimal: No identifiable suicidal ideation.  Patients presenting with no risk factors but with morbid ruminations; may be classified as minimal risk based on the severity of the depressive symptoms  Discharge Diagnoses:   AXIS I:  Major depressive disorder, single episode, severe, specified as with psychotic behavior              Intermittent explosive disorder              Cannabis use disorder AXIS II:  Deferred AXIS III:  History reviewed. No pertinent past medical history. AXIS IV:  occupational problems, other psychosocial or environmental problems, problems related to social environment and problems with primary support group AXIS V:  61-70 mild symptoms  Plan Of Care/Follow-up recommendations:  Activity:  as tolreated Diet:  healthy Tests:  routine Other:  patient to keep his after care appointment  Is patient on multiple antipsychotic therapies at discharge:  No   Has Patient had three or more failed trials of antipsychotic monotherapy by history:  No  Recommended Plan for Multiple Antipsychotic Therapies: NA   Thedore MinsAkintayo, Nivia Gervase, MD 12/26/2013, 10:40 AM

## 2013-12-26 NOTE — [facility] Group Notes (Signed)
[facility] LCSW Group Therapy  12/26/2013 12:31 PM  Type of Therapy:  Group Therapy  Participation Level:  Active  Participation Quality:  Appropriate, Attentive, Sharing and Supportive  Affect:  Appropriate  Cognitive:  Alert and Oriented  Insight:  Developing/Improving  Engagement in Therapy:  Engaged  Modes of Intervention:  Discussion, Exploration, Problem-solving and Support  Summary of Progress/Problems:  Group today consisted of members discussing feelings around their diagnosis.  Group discussed negative, positive, stigmas and feelings about labels and how they associate with mental health.  Benjamin Butler was very involved in discussion with regards to negative feelings associated with mental health and how that affects his reasons for seeking help. He discusses that he feels shameful and becomes angry when people do not understand his problems or issues.  He was able to process different ways to educate others about his diagnosis and how his feelings may be more positive towards others accepting his mental health issues.  Benjamin Butler reports he is resourceful and able to manage on his own and appreciates being alone rather than with others.  Raye SorrowCoble, Nicholad Kautzman N 12/26/2013, 12:31 PM

## 2013-12-26 NOTE — Progress Notes (Signed)
Patient ID: Benjamin Butler, male   DOB: 03/30/1976, 38 y.o.   MRN: [id number] Patient discharged home per MD order.  [facility].  All personal belongings, prescription and medication samples were returned to patient.  Medications reviewed, along with indications for same.  Patient acknowledged that he understood all medications and discharge instructions.  He denies SI/HI/AVH.  Patient is unsure where he will stay, as he does not want to go back to girlfriend's home.  Patient left ambulatory for the bus station.

## 2013-12-28 NOTE — Progress Notes (Signed)
Patient Discharge Instructions:  After Visit Summary (AVS):   Faxed to:  12/28/13 Discharge Summary Note:   Faxed to:  12/28/13 Psychiatric Admission Assessment Note:   Faxed to:  12/28/13 Suicide Risk Assessment - Discharge Assessment:   Faxed to:  12/28/13 Faxed/Sent to the Next Level Care provider:  12/28/13 Faxed to [facility] @ [phone] Faxed to [facility] @ [phone]  Jerelene ReddenSheena E La Russell, 12/28/2013, 4:06 PM

## 2014-01-07 ENCOUNTER — Emergency Department (HOSPITAL_COMMUNITY)
Admission: EM | Admit: 2014-01-07 | Discharge: 2014-01-08 | Disposition: A | Discharge status: Other Acute Inpatient Hospital | Payer: No Typology Code available for payment source | Attending: Emergency Medicine | Admitting: Emergency Medicine

## 2014-01-07 ENCOUNTER — Encounter (HOSPITAL_COMMUNITY): Payer: Self-pay | Admitting: Emergency Medicine

## 2014-01-07 DIAGNOSIS — F32A Depression, unspecified: Secondary | ICD-10-CM

## 2014-01-07 DIAGNOSIS — F329 Major depressive disorder, single episode, unspecified: Secondary | ICD-10-CM | POA: Insufficient documentation

## 2014-01-07 DIAGNOSIS — Z79899 Other long term (current) drug therapy: Secondary | ICD-10-CM | POA: Insufficient documentation

## 2014-01-07 DIAGNOSIS — F323 Major depressive disorder, single episode, severe with psychotic features: Secondary | ICD-10-CM

## 2014-01-07 DIAGNOSIS — F3289 Other specified depressive episodes: Secondary | ICD-10-CM | POA: Insufficient documentation

## 2014-01-07 DIAGNOSIS — R440 Auditory hallucinations: Secondary | ICD-10-CM

## 2014-01-07 DIAGNOSIS — R443 Hallucinations, unspecified: Secondary | ICD-10-CM | POA: Insufficient documentation

## 2014-01-07 DIAGNOSIS — F1210 Cannabis abuse, uncomplicated: Secondary | ICD-10-CM | POA: Insufficient documentation

## 2014-01-07 DIAGNOSIS — Z5900 Homelessness unspecified: Secondary | ICD-10-CM | POA: Insufficient documentation

## 2014-01-07 DIAGNOSIS — R45851 Suicidal ideations: Secondary | ICD-10-CM | POA: Insufficient documentation

## 2014-01-07 DIAGNOSIS — F6381 Intermittent explosive disorder: Secondary | ICD-10-CM

## 2014-01-07 DIAGNOSIS — F17200 Nicotine dependence, unspecified, uncomplicated: Secondary | ICD-10-CM | POA: Insufficient documentation

## 2014-01-07 LAB — BASIC METABOLIC PANEL
BUN: 8 mg/dL (ref 6–23)
CO2: 28 mEq/L (ref 19–32)
CREATININE: 0.89 mg/dL (ref 0.50–1.35)
Calcium: 9.5 mg/dL (ref 8.4–10.5)
Chloride: 99 mEq/L (ref 96–112)
GFR calc Af Amer: >90 mL/min (ref >90)
GLUCOSE: 85 mg/dL (ref 70–99)
POTASSIUM: 4.1 meq/L (ref 3.7–5.3)
Sodium: 138 mEq/L (ref 137–147)

## 2014-01-07 LAB — CBC WITH DIFFERENTIAL/PLATELET
BASOS PCT: 1 % (ref 0–1)
Basophils Absolute: 0.1 10*3/uL (ref 0.0–0.1)
EOS ABS: 0.2 10*3/uL (ref 0.0–0.7)
EOS PCT: 2 % (ref 0–5)
HCT: 43.5 % (ref 39.0–52.0)
HEMOGLOBIN: 14.9 g/dL (ref 13.0–17.0)
LYMPHS ABS: 3 10*3/uL (ref 0.7–4.0)
Lymphocytes Relative: 32 % (ref 12–46)
MCH: 31.4 pg (ref 26.0–34.0)
MCHC: 34.3 g/dL (ref 30.0–36.0)
MCV: 91.8 fL (ref 78.0–100.0)
Monocytes Absolute: 1 10*3/uL (ref 0.1–1.0)
Monocytes Relative: 10 % (ref 3–12)
NEUTROS PCT: 55 % (ref 43–77)
Neutro Abs: 5 10*3/uL (ref 1.7–7.7)
Platelets: 244 10*3/uL (ref 150–400)
RBC: 4.74 MIL/uL (ref 4.22–5.81)
RDW: 12.7 % (ref 11.5–15.5)
WBC: 9.2 10*3/uL (ref 4.0–10.5)

## 2014-01-07 LAB — RAPID URINE DRUG SCREEN, HOSP PERFORMED
Amphetamines: NOT DETECTED
Barbiturates: NOT DETECTED
Benzodiazepines: NOT DETECTED
COCAINE: NOT DETECTED
Opiates: NOT DETECTED
TETRAHYDROCANNABINOL: POSITIVE — AB

## 2014-01-07 LAB — ETHANOL: Alcohol, Ethyl (B): <11 mg/dL (ref 0–11)

## 2014-01-07 MED ORDER — TRAZODONE HCL 50 MG PO TABS
50.0000 mg | ORAL_TABLET | Freq: Every day | ORAL | Status: DC
  Administered 2014-01-07: 50 mg via ORAL
  Filled 2014-01-07: qty 1

## 2014-01-07 MED ORDER — ONDANSETRON HCL 4 MG PO TABS: 4.0000 mg | ORAL_TABLET | Freq: Three times a day (TID) | ORAL | Status: DC | PRN

## 2014-01-07 MED ORDER — ACETAMINOPHEN 325 MG PO TABS
650.0000 mg | ORAL_TABLET | ORAL | Status: DC | PRN
Start: 2014-01-07 — End: 2014-01-08

## 2014-01-07 MED ORDER — FLUOXETINE HCL 20 MG PO CAPS
40.0000 mg | ORAL_CAPSULE | Freq: Every day | ORAL | Status: DC
  Administered 2014-01-07 – 2014-01-08 (×2): 40 mg via ORAL
  Filled 2014-01-07 (×2): qty 2

## 2014-01-07 MED ORDER — IBUPROFEN 200 MG PO TABS: 600.0000 mg | ORAL_TABLET | Freq: Three times a day (TID) | ORAL | Status: DC | PRN

## 2014-01-07 MED ORDER — ZOLPIDEM TARTRATE 5 MG PO TABS: 5.0000 mg | ORAL_TABLET | Freq: Every evening | ORAL | Status: DC | PRN
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
## 2014-01-07 NOTE — ED Notes (Signed)
Pt transferred from triage, presents for medical clearance.  Pt SI, hearing voices telling him to jump off bridge or jump from building.  Denies HI.  Feeling hopeless.  Pt reports he sold his car and is homeless, sleeping in the woods.  Pt reports he works at [employer] but has been calling in due to high anxiety of working at that job.  Pt noncompliant with meds.  Reports 5 wks ago he attempted to jump off bridge.  Pt calm, cooperative at present.

## 2014-01-07 NOTE — ED Provider Notes (Signed)
Medical screening examination/treatment/procedure(s) were performed by non-physician practitioner and as supervising physician I was immediately available for consultation/collaboration.   EKG Interpretation None        Richardean Canalavid H Yao, MD 01/07/14 2103

## 2014-01-07 NOTE — ED Provider Notes (Signed)
CSN: [id number]     Arrival date & time 01/07/14  1853 History  This chart was scribed for non-physician practitioner Benjamin Peliffany Rayssa Atha, PA-C working with Richardean Canalavid H Yao, MD by Dorothey Basemania Sutton, ED Scribe. This patient was seen in room [facility] and the patient's care was started at 7:04 PM.    Chief Complaint  Patient presents with  . Medical Clearance   The history is provided by the patient. No language interpreter was used.   HPI Comments: Benjamin Butler is a 38 y.o. male who presents to the Emergency Department requesting medical clearance for depression. Patient was seen here on 12/18/2013 for similar complaints and received in-patient psychiatric treatment. He states his symptoms have not significantly improved since that time. He has been working overnight shifts at [employer] and states that his job has caused some exacerbation of his depressive symptoms and some aggression. He states that he plans to switch to either a different location or shift. Patient also reports some ongoing problems associated with a break-up with his long-time girlfriend. Patient states that he received a prescription for mood-stabilizing medications, but has not been able to take them regularly yet because it has to be taken with food and he states he has had some financial troubles/issues acquiring food regularly. Patient also states that when he has been able to take the medication, it caused some adverse effects, so he wants some different medication options. He states that he just wants some support and someone to talk to about his problems. Patient reports that he has called a support hotline in the past, which he states helped him a lot, but that he wants someone to talk to in a more private and regular setting. He reports some intermittent suicidal ideations that he states presents during times of high-stress. Patient reports some auditory hallucinations today and states that it is because he has not taken his medication today.  He admits to using marijuana, but denies other illicit drug use. He denies homicidal ideations. Patient has no other pertinent medical history.   Patient here because he wants his medications adjusted. Hasn't been tolerating the Seroquel well Unable to get to his appointment recently because he is now homeless and had to sell his car. Very depressed and suicidal without a frank plan.  History reviewed. No pertinent past medical history. Past Surgical History  Procedure Laterality Date  . Acne cyst removal      back   History reviewed. No pertinent family history. History  Substance Use Topics  . Smoking status: Heavy Tobacco Smoker -- 1.50 packs/day    Types: Cigarettes  . Smokeless tobacco: Never Used  . Alcohol Use: No    Review of Systems  Psychiatric/Behavioral: Positive for suicidal ideas and hallucinations.  All other systems reviewed and are negative.    Allergies  Review of patient's allergies indicates no known allergies.  Home Medications   Current Outpatient Rx  Name  Route  Sig  Dispense  Refill  . FLUoxetine (PROZAC) 40 MG capsule   Oral   Take 1 capsule (40 mg total) by mouth daily.   30 capsule   0   . QUEtiapine (SEROQUEL) 100 MG tablet   Oral   Take 1 tablet (100 mg total) by mouth at bedtime.   30 tablet   0   . traZODone (DESYREL) 50 MG tablet   Oral   Take 1 tablet (50 mg total) by mouth at bedtime.   30 tablet   0  Triage Vitals: BP 112/71  Pulse 81  Temp(Src) 97.8 F (36.6 C) (Oral)  Resp 14  SpO2 98%  Physical Exam  Nursing note and vitals reviewed. Constitutional: He is oriented to person, place, and time. He appears well-developed and well-nourished. No distress.  HENT:  Head: Normocephalic and atraumatic.  Eyes: Conjunctivae are normal.  Neck: Normal range of motion. Neck supple.  Pulmonary/Chest: Effort normal. No respiratory distress.  Abdominal: He exhibits no distension.  Musculoskeletal: Normal range of motion.   Neurological: He is alert and oriented to person, place, and time.  Skin: Skin is warm and dry.  Psychiatric: He is slowed and actively hallucinating. He exhibits a depressed mood. He expresses suicidal ideation. He expresses no homicidal ideation. He expresses no suicidal plans and no homicidal plans.    ED Course  Procedures (including critical care time)  DIAGNOSTIC STUDIES: Oxygen Saturation is 98% on room air, normal by my interpretation.    COORDINATION OF CARE: 7:17 PM- Will order blood labs and UA. Patient will consult to TTS. Will provide patient with resources. Discussed treatment plan with patient at bedside and patient verbalized agreement.     Labs Review Labs Reviewed  CBC WITH DIFFERENTIAL  BASIC METABOLIC PANEL  ETHANOL  URINE RAPID DRUG SCREEN (HOSP PERFORMED)   Imaging Review No results found.   EKG Interpretation None      MDM   Final diagnoses:  Auditory hallucinations  Depression     Will get screening labs and consult TTS for possible medication adjustment. Patient admits to not being able to take his medication because he can't eat due to being homeless with this medication. He also feels like it is more sedating than what is safe for him.  Holding orders placed.  38 y.o.[name]'s evaluation in the Emergency Department is complete. It has been determined that no acute conditions requiring further emergency intervention are present at this time. The patient/guardian have been advised of the diagnosis and plan. We have discussed signs and symptoms that warrant return to the ED, such as changes or worsening in symptoms.  Vital signs are stable at discharge. Filed Vitals:   01/07/14 1855  BP: 112/71  Pulse: 81  Temp: 97.8 F (36.6 C)  Resp: 14    Patient/guardian has voiced understanding and agreed to follow-up with the PCP or specialist.    Dorthula Matas, PA-C 01/07/14 1930

## 2014-01-07 NOTE — ED Notes (Addendum)
Pt reports that he is hearing a voice telling him to "jump from a high building" pt reports he is SI but states "I don't want to hurt anybody else anymore. I've come to the conclusion that I'll just hurt myself." Pt states that he does not want to [facility] to [facility] [employer] after living here, that he has no where else to go.  Pt denies EtOH but states "I smoke a little marijuana to feel better." Pt a&o x4, calm and cooperative in triage. Pt also reports "If I die I will end up in an earth inside the earth. People are building volcanoes to try to reach it, but you have to die to get there. Everything in the Bible is real, and rappers know about it and are being kept silenced."

## 2014-01-08 ENCOUNTER — Inpatient Hospital Stay (HOSPITAL_COMMUNITY)
Admission: AD | Admit: 2014-01-08 | Discharge: 2014-01-12 | DRG: 885 | Disposition: A | Discharge status: Home / Self Care | Payer: Federal, State, Local not specified - Other | Source: Intra-hospital | Attending: Psychiatry | Admitting: Psychiatry

## 2014-01-08 ENCOUNTER — Encounter (HOSPITAL_COMMUNITY): Payer: Self-pay

## 2014-01-08 DIAGNOSIS — Z5900 Homelessness unspecified: Secondary | ICD-10-CM

## 2014-01-08 DIAGNOSIS — Z91199 Patient's noncompliance with other medical treatment and regimen due to unspecified reason: Secondary | ICD-10-CM

## 2014-01-08 DIAGNOSIS — F6381 Intermittent explosive disorder: Secondary | ICD-10-CM | POA: Diagnosis present

## 2014-01-08 DIAGNOSIS — Z79899 Other long term (current) drug therapy: Secondary | ICD-10-CM

## 2014-01-08 DIAGNOSIS — F29 Unspecified psychosis not due to a substance or known physiological condition: Secondary | ICD-10-CM | POA: Diagnosis present

## 2014-01-08 DIAGNOSIS — Z9119 Patient's noncompliance with other medical treatment and regimen: Secondary | ICD-10-CM

## 2014-01-08 DIAGNOSIS — F323 Major depressive disorder, single episode, severe with psychotic features: Secondary | ICD-10-CM

## 2014-01-08 DIAGNOSIS — F1210 Cannabis abuse, uncomplicated: Secondary | ICD-10-CM | POA: Diagnosis present

## 2014-01-08 DIAGNOSIS — F17200 Nicotine dependence, unspecified, uncomplicated: Secondary | ICD-10-CM | POA: Diagnosis present

## 2014-01-08 DIAGNOSIS — R45851 Suicidal ideations: Secondary | ICD-10-CM

## 2014-01-08 DIAGNOSIS — G4700 Insomnia, unspecified: Secondary | ICD-10-CM | POA: Diagnosis present

## 2014-01-08 DIAGNOSIS — Z598 Other problems related to housing and economic circumstances: Secondary | ICD-10-CM

## 2014-01-08 DIAGNOSIS — Z5987 Material hardship due to limited financial resources, not elsewhere classified: Secondary | ICD-10-CM

## 2014-01-08 HISTORY — DX: Depression, unspecified: F32.A

## 2014-01-08 HISTORY — DX: Major depressive disorder, single episode, unspecified: F32.9

## 2014-01-08 HISTORY — DX: Other specified health status: Z78.9

## 2014-01-08 MED ORDER — QUETIAPINE FUMARATE 100 MG PO TABS
100.0000 mg | ORAL_TABLET | Freq: Every day | ORAL | Status: DC
  Administered 2014-01-08 – 2014-01-10 (×3): 100 mg via ORAL
  Filled 2014-01-08 (×5): qty 1

## 2014-01-08 MED ORDER — ONDANSETRON HCL 4 MG PO TABS: 4.0000 mg | ORAL_TABLET | Freq: Three times a day (TID) | ORAL | Status: DC | PRN

## 2014-01-08 MED ORDER — FLUOXETINE HCL 20 MG PO CAPS
40.0000 mg | ORAL_CAPSULE | Freq: Every day | ORAL | Status: DC
  Administered 2014-01-09 – 2014-01-12 (×4): 40 mg via ORAL
  Filled 2014-01-08 (×4): qty 2
  Filled 2014-01-08: qty 28
  Filled 2014-01-08 (×2): qty 2

## 2014-01-08 MED ORDER — ALUM & MAG HYDROXIDE-SIMETH 200-200-20 MG/5ML PO SUSP
30.0000 mL | ORAL | Status: DC | PRN
  Administered 2014-01-09 – 2014-01-12 (×2): 30 mL via ORAL

## 2014-01-08 MED ORDER — ZOLPIDEM TARTRATE 5 MG PO TABS: 5.0000 mg | ORAL_TABLET | Freq: Every evening | ORAL | Status: DC | PRN

## 2014-01-08 MED ORDER — MAGNESIUM HYDROXIDE 400 MG/5ML PO SUSP: 30.0000 mL | Freq: Every day | ORAL | Status: DC | PRN

## 2014-01-08 MED ORDER — QUETIAPINE FUMARATE 100 MG PO TABS: 100.0000 mg | ORAL_TABLET | Freq: Every day | ORAL | Status: DC

## 2014-01-08 MED ORDER — TRAZODONE HCL 50 MG PO TABS
50.0000 mg | ORAL_TABLET | Freq: Every day | ORAL | Status: DC
  Administered 2014-01-08 – 2014-01-11 (×4): 50 mg via ORAL
  Filled 2014-01-08: qty 1
  Filled 2014-01-08: qty 14
  Filled 2014-01-08 (×5): qty 1

## 2014-01-08 MED ORDER — WHITE PETROLATUM GEL
Status: DC | PRN
  Administered 2014-01-08: 1 via TOPICAL
  Filled 2014-01-08 (×2): qty 5

## 2014-01-08 NOTE — Progress Notes (Signed)
Patient ID: Benjamin Butler, male   DOB: 09/28/1976, 38 y.o.   MRN: [id number]  D: Patient is a 38 yr old voluntary admission. Patient went to ED reporting auditory hallucinations to jump off bridge. Does reports seeing shadows at times also. Had a recent breakup with GF and is now living in a car or woods. States he is currently employed at [employer]. Does report some HI toward various co-workers at times but no active plan toward any. Positive for THC and does report smoking a joint daily. Reports not taking any medication once he left here. No pertinent medical problems. Vital signs stable.
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
## 2014-01-08 NOTE — Progress Notes (Signed)
[facility] CL provided pt with a [employer] Orange Card application, [employer] [facility]. Patient stated that he was homeless, so CL [facility]

## 2014-01-08 NOTE — Progress Notes (Signed)
[facility] Group Notes:  (Nursing/MHT/Case Management/Adjunct)  Date:  01/08/2014  Time:  8:00 p.m.   Type of Therapy:  Psychoeducational Skills  Participation Level:  Minimal  Participation Quality:  Resistant  Affect:  Depressed  Cognitive:  Lacking  Insight:  None  Engagement in Group:  Limited  Modes of Intervention:  Education  Summary of Progress/Problems: The patient would only share in group that he slept for the majority of the day.    Hazle CocaGOODMAN, Megahn Killings S 01/08/2014, 10:40 PM

## 2014-01-08 NOTE — Tx Team (Signed)
Initial Interdisciplinary Treatment Plan  PATIENT STRENGTHS: (choose at least two) Ability for insight Active sense of humor Average or above average intelligence Capable of independent living Communication skills Financial means Motivation for treatment/growth Physical Health  PATIENT STRESSORS: Financial difficulties Loss of Girlfriend   PROBLEM LIST: Problem List/Patient Goals Date to be addressed Date deferred Reason deferred Estimated date of resolution  Depression with SI & HI 01/08/14     THC abuse 01/08/14                                                DISCHARGE CRITERIA:  Ability to meet basic life and health needs Adequate post-discharge living arrangements Improved stabilization in mood, thinking, and/or behavior Safe-care adequate arrangements made  PRELIMINARY DISCHARGE PLAN: Outpatient therapy Placement in alternative living arrangements  PATIENT/FAMIILY INVOLVEMENT: This treatment plan has been presented to and reviewed with the patient, Lorna DibbleScott X Edge, and/or family member, .  The patient and family have been given the opportunity to ask questions and make suggestions.  Manuela Schwartzritchett, Newell Wafer [facility] 01/08/2014, 8:00 PM

## 2014-01-08 NOTE — BH Assessment (Signed)
Support paperwork signed and faxed to [facility] TTS at 4:52PM.Support paperwork placed with chart; RN coordinating with [facility] and arranging transport with [facility]  Liliane Mallis C Adriauna Campton, LCSW   

## 2014-01-09 ENCOUNTER — Encounter (HOSPITAL_COMMUNITY): Payer: Self-pay | Admitting: Psychiatry

## 2014-01-09 DIAGNOSIS — F1210 Cannabis abuse, uncomplicated: Secondary | ICD-10-CM

## 2014-01-09 DIAGNOSIS — R45851 Suicidal ideations: Secondary | ICD-10-CM

## 2014-01-09 DIAGNOSIS — F6381 Intermittent explosive disorder: Secondary | ICD-10-CM

## 2014-01-09 DIAGNOSIS — F323 Major depressive disorder, single episode, severe with psychotic features: Principal | ICD-10-CM

## 2014-01-09 MED ORDER — NICOTINE POLACRILEX 2 MG MT GUM
2.0000 mg | CHEWING_GUM | OROMUCOSAL | Status: DC | PRN
Start: 2014-01-09 — End: 2014-01-12
  Administered 2014-01-09 – 2014-01-11 (×9): 2 mg via ORAL
  Filled 2014-01-09 (×5): qty 1
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
## 2014-01-09 NOTE — Tx Team (Addendum)
  Interdisciplinary Treatment Plan Update   Date Reviewed:  01/09/2014  Time Reviewed:  2:17 PM  Progress in Treatment:   Attending groups: Yes Participating in groups: Yes Taking medication as prescribed: Yes  Tolerating medication: Yes Family/Significant other contact made: No Patient understands diagnosis: Yes AEB asking for help with SI, psychosis Discussing patient identified problems/goals with staff: Yes  See initial care plan Medical problems stabilized or resolved: Yes Denies suicidal/homicidal ideation: Unclear  First stated that he has thoughts of suicide, then denied it. Patient has not harmed self or others: Yes  For review of initial/current patient goals, please see plan of care.  Estimated Length of Stay:  2-5 days   Pt is deciding today whether to sign a 72 hr request or not  Reason for Continuation of Hospitalization: Depression Medication stabilization Suicidal ideation  New Problems/Goals identified:  N/A  Discharge Plan or Barriers:   return home, follow up outpt  Additional Comments:  Benjamin Butler is an 38 y.o. male. Pt brought to [facility] by [facility]. Pt in emotional distress during assessment, state:"My life is done. I just don't see a future." Pt was very anxious and tearful at times in relaying information. [facility] and has not been regularly taking meds since. Pt has also not followed up with outpt provider, although he says he did receive a call. States he was too stressed out to make the appointment. Pt reports his job is very stressful--works a [employer] affairs [employer]. Pt also had recent break up of long term girlfriend.   Attendees:  Signature: Thedore MinsMojeed Akintayo, MD 01/09/2014 2:17 PM   Signature: Richelle Itood Tammee Thielke, LCSW 01/09/2014 2:17 PM  Signature: Fransisca KaufmannLaura Davis, NP 01/09/2014 2:17 PM  Signature: Joslyn Devonaroline Beaudry, RN 01/09/2014 2:17 PM  Signature: Liborio NixonPatrice White, RN 01/09/2014 2:17 PM  Signature:  01/09/2014 2:17 PM  Signature:   01/09/2014 2:17 PM  Signature:     Signature:    Signature:    Signature:    Signature:    Signature:      Scribe for Treatment Team:   Richelle Itood Green Quin[place], LCSW  01/09/2014 2:17 PM

## 2014-01-09 NOTE — [facility] Counselor (Signed)
Adult Psychosocial Assessment Update Interdisciplinary Team  [facility]  Admissions Discharges  Date: 12/19/13 Date:  Date: Date:  Date: Date:  Date: Date:  Date: Date:   Changes since the last Psychosocial Assessment (including adherence to outpatient mental health and/or substance abuse treatment, situational issues contributing to decompensation and/or relapse). "I went to stay in a hotel and did not go to my follow up appointment.  I am working 2 jobs and am stressed out.  I was hearing things and had a plan to kill myself."  Today the pt is all over the place, saying he wants to stay one night and then be released and he is not suicidal, then saying he is suicidal and he needs help, but does not want to take medication.  His agenda is unclear.               Discharge Plan 1. Will you be returning to the same living situation after discharge?   [employer]Yes:X  Hotel or car or woods.  It's unclear exactly where he was and what his plan is. No:      If no, what is your plan?           2. Would you like a referral for services when you are discharged? Yes:  x   If yes, for what services?  No:       Mental health       Summary and Recommendations (to be completed by the evaluator) [name] is a 38 YO AA male who is here voluntarily, and is hiding an agenda.  He can benefit from crises stabilization, medication management and referral for services [if he chooses to avail himself to those interventions],and therapeutic milieu.                       Signature:  Ida Rogueorth,  Ducre B, 01/09/2014 3:30 PM
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
## 2014-01-09 NOTE — Progress Notes (Signed)
Adult Psychoeducational Group Note  Date:  01/09/2014 Time:  10:25 PM  Group Topic/Focus:  Wrap-Up Group:   The focus of this group is to help patients review their daily goal of treatment and discuss progress on daily workbooks.  Participation Level:  Active  Participation Quality:  Appropriate  Affect:  Appropriate  Cognitive:  Appropriate  Insight: Appropriate  Engagement in Group:  Engaged  Modes of Intervention:  Discussion  Additional Comments:  Patient says that he had a pretty good day. Pt states that nothing special happened and he stayed inside and didn't go out with his peers to the playground.  Percell LocusJOHNSON,TAWANA 01/09/2014, 10:25 PM

## 2014-01-09 NOTE — [facility] Group Notes (Signed)
[facility] LCSW Group Therapy  01/09/2014 , 2:24 PM   Type of Therapy:  Group Therapy  Participation Level:  Active  Participation Quality:  Attentive  Affect:  Appropriate  Cognitive:  Alert  Insight:  Improving  Engagement in Therapy:  Engaged  Modes of Intervention:  Discussion, Exploration and Socialization  Summary of Progress/Problems: Today's group focused on the term Diagnosis.  Participants were asked to define the term, and then pronounce whether it is a negative, positive or neutral term.  Benjamin Butler got up and left group after I asked for his input, he said he wasn't listening and would I repeat the question, and I moved on to someone else.  He surprised me when he returned as if nothing had happened and joined in.  The subject of diagnosis got him riled up about medications, and he made a comment to the extent that he does not want to be a zombie.  I took a moment to lecture about rights of a patient to refuse, how the intent is to help people and how it is essential patients let the Dr know what side effects they are experiencing, including sedation.  He stated that he could not work if he was medicated, and I pointed out that he can't when he is not because he is here in the hospital.  He also got into describing "powers through spirits and spirituality."  This when the issue of being chased by demons was brought up by another pt.  Benjamin Butler, Benjamin Butler 01/09/2014 , 2:24 PM

## 2014-01-09 NOTE — [facility] Group Notes (Signed)
[facility] Group Notes:  (Nursing/MHT/Case Management/Adjunct)  Date:  01/09/2014  Time:  0900 am  Type of Therapy:  Psychoeducational Skills  Participation Level:  Active  Participation Quality:  Appropriate and Attentive  Affect:  Appropriate  Cognitive:  Alert and Appropriate  Insight:  Limited  Engagement in Group:  Supportive  Modes of Intervention:  Support  Summary of Progress/Problems:  Cranford MonBeaudry, Treven Holtman Evans 01/09/2014, 1:23 PM

## 2014-01-09 NOTE — [facility] Suicide Risk Assessment (Signed)
   Nursing information obtained from:  Patient Demographic factors:  Male;Low socioeconomic status;Living alone Current Mental Status:  Suicidal ideation indicated by patient;Self-harm thoughts;Thoughts of violence towards others Loss Factors:  Loss of significant relationship;Financial problems / change in socioeconomic status Historical Factors:  Family history of suicide Risk Reduction Factors:  Employed;Positive coping skills or problem solving skills Total Time spent with patient: 30 minutes  CLINICAL FACTORS:   Severe Anxiety and/or Agitation Depression:   Aggression Comorbid alcohol abuse/dependence Hopelessness Impulsivity Insomnia Alcohol/Substance Abuse/Dependencies Currently Psychotic  Psychiatric Specialty Exam: Physical Exam  Psychiatric: His speech is normal. His affect is labile. He is agitated and actively hallucinating. Cognition and memory are normal. He expresses impulsivity. He exhibits a depressed mood. He expresses suicidal ideation.    Review of Systems  Constitutional: Negative.   Eyes: Negative.   Respiratory: Negative.   Cardiovascular: Negative.   Gastrointestinal: Negative.   Musculoskeletal: Negative.   Skin: Negative.   Neurological: Negative.   Endo/Heme/Allergies: Negative.   Psychiatric/Behavioral: Positive for depression, hallucinations and substance abuse. The patient is nervous/anxious and has insomnia.     Blood pressure 101/70, pulse 97, temperature 98 F (36.7 C), temperature source Oral, resp. rate 18, height 5' 11.5" (1.816 m), weight 91.989 kg (202 lb 12.8 oz).Body mass index is 27.89 kg/(m^2).  General Appearance: Fairly Groomed  Patent attorneyye Contact::  Minimal  Speech:  Clear and Coherent and Normal Rate  Volume:  Decreased  Mood:  Irritable  Affect:  Restricted  Thought Process:  Goal Directed and Linear  Orientation:  Full (Time, Place, and Person)  Thought Content:  Hallucinations: Auditory  Suicidal Thoughts:  Yes.  without  intent/plan  Homicidal Thoughts:  No  Memory:  Immediate;   Fair Recent;   Fair Remote;   Fair  Judgement:  Impaired  Insight:  Shallow  Psychomotor Activity:  Decreased  Concentration:  Fair  Recall:  [employer]Fair  Fund of Knowledge:Fair  Language: Good  Akathisia:  No  Handed:  Right  AIMS (if indicated):     Assets:  Communication Skills Desire for Improvement Physical Health  Sleep:  Number of Hours: 6.25   Musculoskeletal: Strength & Muscle Tone: within normal limits Gait & Station: normal Patient leans: N/A  COGNITIVE FEATURES THAT CONTRIBUTE TO RISK:  Closed-mindedness Polarized thinking    SUICIDE RISK:   Minimal: No identifiable suicidal ideation.  Patients presenting with no risk factors but with morbid ruminations; may be classified as minimal risk based on the severity of the depressive symptoms  PLAN OF CARE:1. Admit for crisis management and stabilization. 2. Medication management to reduce current symptoms to base line and improve the  patient's overall level of functioning 3. Treat health problems as indicated. 4. Develop treatment plan to decrease risk of relapse upon discharge and the need for  readmission. 5. Psycho-social education regarding relapse prevention and self care. 6. Health care follow up as needed for medical problems. 7. Restart home medications where appropriate.   I certify that inpatient services furnished can reasonably be expected to improve the patient's condition.  Thedore MinsAkintayo, Kaydince Towles, MD 01/09/2014, 10:58 AM

## 2014-01-09 NOTE — Progress Notes (Signed)
Patient ID: Benjamin Butler, male   DOB: 03/29/1976, 38 y.o.   MRN: [id number] D: patient presents with bright affect.  He is talkative in group and interacts well with staff and peers.  Patient admitted that he stopped taking his meds regularly after he was discharged 2 weeks ago.  Patient reported that he "stayed in a hotel for 2 weeks and ran out of money."  He stated, "I'll never come back here again.  I know what I need to do."  Patient reports passive SI and auditory hallucinations occasionally.  Patient met with treatment and informed MD that he would like to sign a 72-hour discharge. He stated, "I was living in the woods before I came here.  I need to get back to my job at [employer] and I have a part-time job lined up at [employer]." He denies any HI/passive SI; he denies any AVH at this time.  A: continue to monitor medication management and MD orders.  Safety checks completed every 15 minutes per protocol.  R: patient is receptive to staff; his behavior is appropriate.

## 2014-01-10 NOTE — Progress Notes (Signed)
NUTRITION ASSESSMENT  Pt identified as at risk on the Malnutrition Screen Tool  INTERVENTION: 1. Educated patient on the importance of nutrition and encouraged intake of food and beverages. 2. Discussed weight goals. 3. Supplements: none at this time.  NUTRITION DIAGNOSIS: Unintentional weight loss related to sub-optimal intake as evidenced by pt report.   Goal: Pt to meet >/= 90% of their estimated nutrition needs.  Monitor:  PO intake  Assessment:  Patient admitted with major depressive disorder with psychotic features.  R/O schizoaffective d/o.  Intermittent explosive disorder. Marijuana abuse.  Known from last admit. Now homeless.  Reported a UBW of 225 lbs with a weight loss of 25 lbs in the past 2 months prior to last admit.  Weight currently stable from last admit. Patient states that he is eating well now but not prior to admit secondary to homelessness.  States that he would steal packets of oatmeal from hotels and states that he needed food stamps.  38 y.o. male  Height: Ht Readings from Last 1 Encounters:  01/08/14 5' 11.5" (1.816 m)    Weight: Wt Readings from Last 1 Encounters:  01/08/14 202 lb 12.8 oz (91.989 kg)    Weight Hx: Wt Readings from Last 10 Encounters:  01/08/14 202 lb 12.8 oz (91.989 kg)  12/19/13 200 lb (90.719 kg)  12/18/13 208 lb (94.348 kg)    BMI:  Body mass index is 27.89 kg/(m^2). Pt meets criteria for overweight based on current BMI.  WNL per body frame.  Estimated Nutritional Needs: Kcal: 25-30 kcal/kg Protein: > 1 gram protein/kg Fluid: 1 ml/kcal  Diet Order: General Pt is also offered choice of unit snacks mid-morning and mid-afternoon.  Pt is eating as desired.   Lab results and medications reviewed.   Oran ReinLaura Jobe, RD, LDN Clinical Inpatient Dietitian Pager:  [phone] Weekend and after hours pager:  [phone]

## 2014-01-10 NOTE — [facility] Group Notes (Signed)
[facility]   01/10/2014 11:35 AM  Participation Quality:  Engaged  Mood/Affect:  Depressed  Depression Rating:  7-8  Anxiety Rating:  "I don't know how I feel."  Thoughts of Suicide:  No Will you contract for safety?   NA  Current AVH:  No  Plan for Discharge/Comments:  Benjamin Butler continues to hold his card close to his chest.  "I don't know my plan when I leave.  I was staying at the cheapest hotel off of [address] close [facility] so I could easily get to work."  Does not present as anxious about funds or where he will stay.  Said he chose not to sign a 72 hr request.  "I'll just leave it up to the Dr."  Transportation Means:  self  Supports: ex  Benjamin Butler, Benjamin Butler

## 2014-01-10 NOTE — Progress Notes (Signed)
Patient ID: Benjamin Butler, male   DOB: June 06, 1976, 38 y.o.   MRN: [id number] D: Patient in room on approach. Pt stated he had a good day. Pt mood and flat appeared depressed. Pt denies SI/HI/AVH and pain. Pt endorses auditory hallucination without command. Pt attended evening wrap up group and Interacted appropriately with peers. Pt denies any needs or concerns.  Cooperative with assessment. No acute distressed noted at this time.   A: Met with pt 1:1. Medications administered as prescribed. Writer encouraged pt to discuss feelings. Pt encouraged to come to staff with any question or concerns.   R: Patient remains safe. He is complaint with medications and denies any adverse reaction. Continue current POC.

## 2014-01-10 NOTE — [facility] Group Notes (Signed)
[facility]  01/10/2014  1:39 PM  Type of Therapy:  [employer] Presentation   Participation Level:  Active  Participation Quality:  Intrusive  Affect:  Blunted  Cognitive:  Disorganized  Insight:  Lacking  Engagement in Therapy:  Engaged  Modes of Intervention:  Discussion, Education and Socialization   Summary of Progress/Problems:  Benjamin Butler from [employer] came to present his recovery story and play the guitar.  He came in 20 minutes late and closed the window curtains.   Tapped his feet while the speaker told his story.  Benjamin Butler asked the location of [facility].  Benjamin Butler interrupted group and told the writer if he could see him afterwards to talk about getting his job back.    Moved his chair next to Clinical research associatewriter and kept on trying to talk to Clinical research associatewriter about his situation.   Writer told him that he needs to wait until the end of group.  He was able to be redirected.  He told the speaker that he enjoyed his music.    Benjamin Butler   01/10/2014  1:39 PM

## 2014-01-10 NOTE — Progress Notes (Signed)
Adult Psychoeducational Group Note  Date:  01/10/2014 Time:  10:11 PM  Group Topic/Focus:  Wrap-Up Group:   The focus of this group is to help patients review their daily goal of treatment and discuss progress on daily workbooks.  Participation Level:  Active  Participation Quality:  Appropriate  Affect:  Appropriate  Cognitive:  Alert  Insight: Appropriate  Engagement in Group:  Engaged  Modes of Intervention:  Discussion  Additional Comments:  Pt stated that he had a good day. He really enjoyed being able to go outside for a little while. He also stated that he does not agree with some of things that Dr.A was saying to him. However, he did agree to put his differences aside and talk to him tomorrow.  Kaleen OdeaCOOKE, Gaylon Bentz R 01/10/2014, 10:11 PM

## 2014-01-10 NOTE — Progress Notes (Signed)
[facility] MD Progress Note  01/10/2014 10:31 AM Benjamin Butler  MRN:  [id number] Subjective:   Patient states "Nobody understands me. 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 I hear a whole lot of voices. Hear people having fun. Saw a shadow last night. [employer] is playing games with me. I have not been working. I have no stability living in the woods. Still have thoughts of jumping off a building. My whole life is so screwed up. I have no support in my life."  Objective:  Patient reports ongoing depression, psychosis, and anxiety. Rates his depression and anxiety at eight. He continues to endorse suicidal thoughts with plan to jump off a building. Benjamin Butler appears very depressed during his follow up assessment. He denies feeling paranoid but talks at length about how members of treatment team do not make an effort to understand him. Patient also talks about [employer] not cooperating with him. However, on his last admission the patient reported that he had made threats on his job and was worried about being fired. The patient is attending groups today and is complaint with his scheduled medications.   Diagnosis:   DSM5: AXIS I: Major depressive disorder, single episode, severe, specified as with psychotic behavior, Cannabis abuse, Intermittent explosive disorder  AXIS II: Deferred  AXIS III:  Past Medical History   Diagnosis  Date   .  Medical history non-contributory    .  Depression     AXIS IV: economic problems, educational problems, housing problems, occupational problems, other psychosocial or environmental problems, problems related to social environment and problems with primary support group  AXIS V: 21-30 behavior considerably influenced by delusions or hallucinations OR serious impairment in judgment, communication OR inability to function in almost all areas   ADL's:  Intact  Sleep: Fair  Appetite:  Good  Suicidal Ideation:  Suicidal thoughts  Homicidal Ideation:  Denies AEB (as evidenced  by):  Psychiatric Specialty Exam: Physical Exam  Review of Systems  Constitutional: Negative.   HENT: Negative.   Eyes: Negative.   Respiratory: Negative.   Cardiovascular: Negative.   Gastrointestinal: Negative.   Genitourinary: Negative.   Musculoskeletal: Negative.   Skin: Negative.   Neurological: Negative.   Endo/Heme/Allergies: Negative.   Psychiatric/Behavioral: Positive for depression, suicidal ideas, hallucinations and substance abuse (UDS positive for marijuana ). Negative for memory loss. The patient is nervous/anxious. The patient does not have insomnia.     Blood pressure 117/72, pulse 94, temperature 98.3 F (36.8 C), temperature source Oral, resp. rate 18, height 5' 11.5" (1.816 m), weight 91.989 kg (202 lb 12.8 oz).Body mass index is 27.89 kg/(m^2).  General Appearance: Casual  Eye Contact::  Good  Speech:  Clear and Coherent  Volume:  Normal  Mood:  Anxious and Dysphoric  Affect:  Flat  Thought Process:  Goal Directed and Intact  Orientation:  Full (Time, Place, and Person)  Thought Content:  Hallucinations: Auditory Visual  Suicidal Thoughts:  Yes.  with intent/plan  Homicidal Thoughts:  No  Memory:  Immediate;   Good Recent;   Good Remote;   Good  Judgement:  Impaired  Insight:  Lacking  Psychomotor Activity:  Increased and Restlessness  Concentration:  Fair  Recall:  Good  Fund of Knowledge:Fair  Language: Good  Akathisia:  No  Handed:  Right  AIMS (if indicated):     Assets:  Communication Skills Desire for Improvement Leisure Time Physical Health Resilience Vocational/Educational  Sleep:  Number of Hours: 6.75   Musculoskeletal: Strength &  Muscle Tone: within normal limits Gait & Station: normal Patient leans: N/A  Current Medications: Current Facility-Administered Medications  Medication Dose Route Frequency Provider Last Rate Last Dose  . alum & mag hydroxide-simeth (MAALOX/MYLANTA) 200-200-20 MG/5ML suspension 30 mL  30 mL Oral Q4H  PRN Nanine MeansJamison Lord, NP   30 mL at 01/09/14 1952  . FLUoxetine (PROZAC) capsule 40 mg  40 mg Oral Daily Nanine MeansJamison Lord, NP   40 mg at 01/10/14 0813  . magnesium hydroxide (MILK OF MAGNESIA) suspension 30 mL  30 mL Oral Daily PRN Nanine MeansJamison Lord, NP      . nicotine polacrilex (NICORETTE) gum 2 mg  2 mg Oral PRN Deklen Popelka   2 mg at 01/09/14 2149  . ondansetron (ZOFRAN) tablet 4 mg  4 mg Oral Q8H PRN Nanine MeansJamison Lord, NP      . QUEtiapine (SEROQUEL) tablet 100 mg  100 mg Oral QHS Nanine MeansJamison Lord, NP   100 mg at 01/09/14 2232  . traZODone (DESYREL) tablet 50 mg  50 mg Oral QHS Nanine MeansJamison Lord, NP   50 mg at 01/09/14 2232    Lab Results: No results found for this or any previous visit (from the past 48 hour(s)).  Physical Findings: AIMS: Facial and Oral Movements Muscles of Facial Expression: None, normal Lips and Perioral Area: None, normal Jaw: None, normal Tongue: None, normal,Extremity Movements Upper (arms, wrists, hands, fingers): None, normal Lower (legs, knees, ankles, toes): None, normal, Trunk Movements Neck, shoulders, hips: None, normal, Overall Severity Severity of abnormal movements (highest score from questions above): None, normal Incapacitation due to abnormal movements: None, normal Patient's awareness of abnormal movements (rate only patient's report): No Awareness, Dental Status Current problems with teeth and/or dentures?: No Does patient usually wear dentures?: No  CIWA:    COWS:     Treatment Plan Summary: Daily contact with patient to assess and evaluate symptoms and progress in treatment Medication management  Plan: 1. Continue crisis management and stabilization.  2. Medication management: Continue Prozac 40 mg daily for depression, Seroquel 100 mg hs for psychosis, and Trazodone 50 mg hs prn insomnia.  3. Encouraged patient to attend groups and participate in group counseling sessions and activities.  4. Discharge plan in progress. Social worker to assist with housing  situation. 5. Continue current treatment plan.  6. Address health issues: Vitals reviewed and stable.   Medical Decision Making Problem Points:  Established problem, worsening (2), Review of last therapy session (1) and Review of psycho-social stressors (1) Data Points:  Review or order clinical lab tests (1) Review and summation of old records (2) Review of medication regiment & side effects (2) Review of new medications or change in dosage (2)  I certify that inpatient services furnished can reasonably be expected to improve the patient's condition.   Fransisca KaufmannDAVIS, LAURA NP-C 01/10/2014, 10:31 AM   Patient seen, evaluated and I agree with notes by Nurse Practitioner. Thedore MinsMojeed Berneita Sanagustin, MD

## 2014-01-10 NOTE — Progress Notes (Signed)
Patient ID: Benjamin DibbleScott X Butler, male   DOB: 07/10/1976, 38 y.o.   MRN: [id number] D: patient continues to verbalize his need to be here.  He fluctuates between feeling suicidal and hearing voices.  Patient reports depressive symptoms rating his depression as a 7; hopelessness as a 10.  He reports passive SI and will contract for safety.  He reports low energy and poor concentration.  Patient has been in day room interacting well with his peers.  He stated to staff "[employer] wants me all over the [place].  My job here doesn't want me to transfer. [facility] [place].  I also have a part-time job lined up at [employer]."  Patient reports his current living situation is poor.  He lived in a hotel for 2 weeks and ran out of money.  He states he has been living in the woods.  He denies HI; reports some auditory hallucinations.  A: continue to monitor medication management and MD orders.  Safety checks completed every 15 minutes per protocol.  R: patient is receptive to staff; his behavior is appropriate.

## 2014-01-10 NOTE — [facility] Group Notes (Signed)
Adult Psychoeducational Group Note  Date:  01/10/2014 Time:  1:54 PM  Group Topic/Focus:  Managing Feelings:   The focus of this group is to identify what feelings patients have difficulty handling and develop a plan to handle them in a healthier way upon discharge.  Participation Level:  Active  Participation Quality:  Appropriate, Redirectable and Sharing  Affect:  Appropriate  Cognitive:  Alert  Insight: Limited  Engagement in Group:  Engaged  Modes of Intervention:  Discussion, Education and Support  Additional Comments:  Pt was actively engaged in group but was getting off topic at times and had to be redirected.   Jamee Pacholski P 01/10/2014, 1:54 PM

## 2014-01-11 MED ORDER — QUETIAPINE FUMARATE 200 MG PO TABS
200.0000 mg | ORAL_TABLET | Freq: Every day | ORAL | Status: DC
  Administered 2014-01-11: 200 mg via ORAL
  Filled 2014-01-11: qty 14
  Filled 2014-01-11 (×2): qty 1

## 2014-01-11 NOTE — Progress Notes (Signed)
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    A:  Notified AC of pt's concerns, AC talked with patient, MD and NP notified of patient's paranoia with staff.Emotional support provided, Encouraged pt to continue with treatment plan and attend all group activities, q15 min checks maintained for safety.  R:  Pt is going to groups, is very suspicious of staff, wants to d/c tomorrow, anxious/depressed during interaction today, pt has been otherwise cooperative with staff today.

## 2014-01-11 NOTE — Progress Notes (Signed)
Patient ID: Benjamin Butler, male   DOB: 06/30/1976, 38 y.o.   MRN: [id number] [facility]  01/11/2014 11:31 AM Benjamin Butler  MRN:  [id number] Subjective: " I need a room mate when I get out of here and I want you to refer me to a doctor close to [place] side of the city.'' Objective: Patient seen and chart is reviewed. He is endorsing decreased paranoia, mood lability, angry outburst, racing thoughts and depressive symptoms. He denies auditory/visual hallucinations, suicidal or homicidal thoughts. Patient has been attending the unit milieu and compliant with his medications.   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 Medical history non-contributory   . Depression    Axis IV: other psychosocial or environmental problems, problems related to social environment and problems with primary support group  ADL's:  Intact  Sleep: Fair  Appetite:  Fair  Suicidal Ideation:  Plan:  denies Intent:  denies Means:  denies Homicidal Ideation:  Plan:  denies Intent:  denies Means:  denies AEB (as evidenced by):  Psychiatric Specialty Exam: Physical Exam  Psychiatric: His speech is normal and behavior is normal. Judgment normal. His mood appears anxious. Cognition and memory are normal. He exhibits a depressed mood.    Review of Systems  Constitutional: Negative.   HENT: Negative.   Eyes: Negative.   Respiratory: Negative.   Cardiovascular: Negative.   Gastrointestinal: Negative.    Genitourinary: Negative.   Musculoskeletal: Negative.   Skin: Negative.   Neurological: Negative.   Endo/Heme/Allergies: Negative.   Psychiatric/Behavioral: Positive for depression. The patient is nervous/anxious.     Blood pressure 119/83, pulse 116, temperature 98 F (36.7 C), temperature source Oral, resp. rate 18, height 5' 11.5" (1.816 m), weight 91.989 kg (202 lb 12.8 oz).Body mass index is 27.89 kg/(m^2).  General Appearance: fairly groomed  Patent attorneyye Contact::  Fair  Speech:  Clear and Coherent  Volume:  Normal  Mood: Depressed   Affect:  Labile and Tearful  Thought Process:  Coherent and Goal Directed  Orientation:  Full (Time, Place, and Person)  Thought Content:  Denies  Suicidal Thoughts: denies  Homicidal Thoughts:  denies  Memory:  Immediate;   Fair Recent;   Fair Remote;   Fair  Judgement:  fair  Insight:  marginal  Psychomotor Activity:  normal  Concentration:  Fair  Recall:  [employer]  Fund of Knowledge:Fair  Language: Good  Akathisia:  No  Handed:  Right  AIMS (if indicated):     Assets:  Communication Skills Desire for Improvement Physical Health Social Support  Sleep:  Number of Hours: 6   Musculoskeletal: Strength & Muscle Tone: within normal limits Gait & Station: normal Patient leans: N/A  Current Medications: Current Facility-Administered Medications  Medication Dose Route Frequency Provider Last Rate Last Dose  . alum & mag hydroxide-simeth (MAALOX/MYLANTA) 200-200-20 MG/5ML suspension 30 mL  30 mL Oral Q4H PRN Nanine MeansJamison Lord, NP   30 mL at 01/09/14 1952  . FLUoxetine (PROZAC)  capsule 40 mg  40 mg Oral Daily Nanine Means, NP   40 mg at 01/11/14 0800  . magnesium hydroxide (MILK OF MAGNESIA) suspension 30 mL  30 mL Oral Daily PRN Nanine Means, NP      . nicotine polacrilex (NICORETTE) gum 2 mg  2 mg Oral PRN Mistey Hoffert   2 mg at 01/11/14 0802  . ondansetron (ZOFRAN) tablet 4 mg  4 mg Oral Q8H PRN Nanine Means, NP      . QUEtiapine (SEROQUEL)  tablet 100 mg  100 mg Oral QHS Nanine Means, NP   100 mg at 01/10/14 2211  . traZODone (DESYREL) tablet 50 mg  50 mg Oral QHS Nanine Means, NP   50 mg at 01/10/14 2211    Lab Results:  No results found for this or any previous visit (from the past 48 hour(s)).  Physical Findings: AIMS: Facial and Oral Movements Muscles of Facial Expression: None, normal Lips and Perioral Area: None, normal Jaw: None, normal Tongue: None, normal,Extremity Movements Upper (arms, wrists, hands, fingers): None, normal Lower (legs, knees, ankles, toes): None, normal, Trunk Movements Neck, shoulders, hips: None, normal, Overall Severity Severity of abnormal movements (highest score from questions above): None, normal Incapacitation due to abnormal movements: None, normal Patient's awareness of abnormal movements (rate only patient's report): No Awareness, Dental Status Current problems with teeth and/or dentures?: No Does patient usually wear dentures?: No  CIWA:    COWS:     Treatment Plan Summary: Daily contact with patient to assess and evaluate symptoms and progress in treatment Medication management  Plan:1. Admit for crisis management and stabilization. 2. Medication management to reduce current symptoms to base line and improve the  patient's overall level of functioning: -Continue Prozac to  40mg  daily for depression. - Increase Seroquel to 200mg  qhs for insomnia and psychosis 3. Treat health problems as indicated. 4. Develop treatment plan to decrease risk of relapse upon discharge and the need for  readmission. 5. Psycho-social education regarding relapse prevention and self care. 6. Health care follow up as needed for medical problems.   Medical Decision Making Problem Points:  Established problem, improving (1), Review of last therapy session (1) and Review of psycho-social stressors (1) Data Points:  Order Aims Assessment (2) Review or order clinical lab tests (1) Review of medication  regiment & side effects (2) Review of new medications or change in dosage (2)  I certify that inpatient services furnished can reasonably be expected to improve the patient's condition.   Thedore Mins, MD 01/11/2014, 11:31 AM

## 2014-01-11 NOTE — [facility] Group Notes (Signed)
[facility] Group Notes:  (Counselor/Nursing/MHT/Case Management/Adjunct)  01/11/2014 1:15PM  Type of Therapy:  Group Therapy  Participation Level:  Active  Participation Quality:  Appropriate  Affect:  Flat  Cognitive:  Oriented  Insight:  Improving  Engagement in Group:  Limited  Engagement in Therapy:  Limited  Modes of Intervention:  Discussion, Exploration and Socialization  Summary of Progress/Problems: The topic for group was balance in life.  Pt participated in the discussion about when their life was in balance and out of balance and how this feels.  Pt discussed ways to get back in balance and short term goals they can work on to get where they want to be.  [place]'s contributions were minimal.  He checked out for the first part of group.  Both the leader and others engaged him, and he responded in kind.  Talked about being balanced when he has a place to stay and a full time job.  Since has neither, he is unbalanced now.  Also talked about how he is susceptible to being manipulated by others, especially people who are experts at getting others to do their bidding.   Ida Rogueorth, Temia Debroux B 01/11/2014 3:19 PM

## 2014-01-11 NOTE — Tx Team (Signed)
  Interdisciplinary Treatment Plan Update   Date Reviewed:  01/11/2014  Time Reviewed:  10:19 AM  Progress in Treatment:   Attending groups: Yes Participating in groups: Yes Taking medication as prescribed: Yes  Tolerating medication: Yes Family/Significant other contact made: Yes  Patient understands diagnosis: Yes  Discussing patient identified problems/goals with staff: Yes Medical problems stabilized or resolved: Yes Denies suicidal/homicidal ideation: Yes Patient has not harmed self or others: Yes  For review of initial/current patient goals, please see plan of care.  Estimated Length of Stay:  Likely d/c tomorrow  Reason for Continuation of Hospitalization:   New Problems/Goals identified:  N/A  Discharge Plan or Barriers:   return home, follow up outpt  Additional Comments:  Attendees:  Signature: Thedore MinsMojeed Akintayo, MD 01/11/2014 10:19 AM   Signature: Richelle Itood Natahsa Marian, LCSW 01/11/2014 10:19 AM  Signature: Fransisca KaufmannLaura Davis, NP 01/11/2014 10:19 AM  Signature: Joslyn Devonaroline Beaudry, RN 01/11/2014 10:19 AM  Signature: Liborio NixonPatrice White, RN 01/11/2014 10:19 AM  Signature:  01/11/2014 10:19 AM  Signature:   01/11/2014 10:19 AM  Signature:    Signature:    Signature:    Signature:    Signature:    Signature:      Scribe for Treatment Team:   Richelle Itood Josilynn Losh, LCSW  01/11/2014 10:19 AM

## 2014-01-11 NOTE — [facility] Group Notes (Signed)
Adult Psychoeducational Group Note  Date:  01/11/2014 Time:  0900 am  Group Topic/Focus:  Orientation:   The focus of this group is to educate the patient on the purpose and policies of crisis stabilization and provide a format to answer questions about their admission.  The group details unit policies and expectations of patients while admitted.  Participation Level:  Active  Participation Quality:  Attentive, Monopolizing and Sharing  Affect:  Appropriate  Cognitive:  Alert, Appropriate and Oriented  Insight: Improving  Engagement in Group:  Engaged, Monopolizing and Off Topic  Modes of Intervention:  Clarification, Discussion, Education, Orientation and Support  Additional Comments:  Pt was attentive during groups, shared but did monopolize some, redirectable and showed some insight about triggers to his anxiety.  Alfonse Sprucehorne, Shawnae Leiva Brooke 01/11/2014, 1:14 PM

## 2014-01-11 NOTE — [facility] Suicide Risk Assessment (Signed)
[facility] INPATIENT:  Family/Significant Other Suicide Prevention Education  Suicide Prevention Education:  Patient Refusal for Family/Significant Other Suicide Prevention Education: The patient Benjamin Butler has refused to provide written consent for family/significant other to be provided Family/Significant Other Suicide Prevention Education during admission and/or prior to discharge.  Physician notified.  Simona HuhYang, Mattia Liford 01/11/2014, 11:29 AM

## 2014-01-11 NOTE — Progress Notes (Signed)
Patient ID: Benjamin Butler, male   DOB: [birth year], 38 y.o.   MRN: [id number] D: Patient in hallway on approach. Pt stated he had a good day. Pt mood and flat appeared appropriate to situation. Pt reports possible discharge tomorrow and [facility] with [facility].  Pt denies SI/HI/AVH and pain.  Pt did not attend evening karaoke. Pt is observed on unit interacting well with peers. Pt denies any needs or concerns.  Cooperative with assessment. No acute distressed noted at this time.   A: Met with pt 1:1. Medications administered as prescribed. Writer encouraged pt to discuss feelings. Pt encouraged to come to staff with any question or concerns.   R: Patient remains safe. He is complaint with medications and denies any adverse reaction. Continue current POC.

## 2014-01-12 MED ORDER — FLUOXETINE HCL 40 MG PO CAPS: 40.0000 mg | ORAL_CAPSULE | Freq: Every day | ORAL | Status: DC

## 2014-01-12 MED ORDER — QUETIAPINE FUMARATE 200 MG PO TABS: 200.0000 mg | ORAL_TABLET | Freq: Every day | ORAL | Status: DC

## 2014-01-12 MED ORDER — TRAZODONE HCL 50 MG PO TABS: 50.0000 mg | ORAL_TABLET | Freq: Every day | ORAL | Status: DC

## 2014-01-12 MED ORDER — QUETIAPINE FUMARATE 200 MG PO TABS
200.0000 mg | ORAL_TABLET | Freq: Every day | ORAL | Status: DC
Start: 2014-01-12 — End: 2014-01-12

## 2014-01-12 NOTE — Progress Notes (Signed)
[facility] :  Will you be returning to the same living situation after discharge: No.  [facility] about [facility] and sleeping [place]. At discharge, do you have transportation home?:Yes,  bus pass Do you have the ability to pay for your medications:Yes,  mental health  Release of information consent forms completed and in the chart;  Patient's signature needed at discharge.  Patient to Follow up at: Follow-up Information   Follow up with [facility]. (Go to the walk-in clinc M-F between 8 and 9 AM for your hospital follow up appointment)    Contact information:   [address]  [place]  [phone]      Patient denies SI/HI:   Yes,  yes    Safety Planning and Suicide Prevention discussed:  Yes,  yes  Ida Rogueorth, Tristan Bramble B 01/12/2014, 12:20 PM

## 2014-01-12 NOTE — Progress Notes (Signed)
Patient ID: Benjamin Butler, male   DOB: 09/28/1976, 38 y.o.   MRN: [id number]. Patient denies si/hi/avh. Pt verbalizes understanding of discharge instructions, prescriptions and follow up appts. Pt signed for their belongings from locker and was given his sample medications.  Pt was walked to the lobby and given (2) bus passes.

## 2014-01-12 NOTE — Discharge Summary (Signed)
Physician Discharge Summary Note  Patient:  Benjamin Butler is an 38 y.o., male MRN:  [id number] DOB:  1975/11/06 Patient phone:  [phone] (home)  Patient address:   [address] [place] [address],  Total Time spent with patient: 20 minutes  Date of Admission:  01/08/2014 Date of Discharge: 01/12/14  Reason for Admission:  Depression, Psychosis   Discharge Diagnoses: Active Problems:   Intermittent explosive disorder   Major depressive disorder, single episode, severe, specified as with psychotic behavior   Cannabis abuse   Psychosis   Psychiatric Specialty Exam: Physical Exam  Psychiatric: He has a normal mood and affect. His speech is normal and behavior is normal. Judgment and thought content normal. Cognition and memory are normal.    Review of Systems  Constitutional: Negative.   HENT: Negative.   Eyes: Negative.   Respiratory: Negative.   Cardiovascular: Negative.   Gastrointestinal: Negative.   Genitourinary: Negative.   Musculoskeletal: Negative.   Skin: Negative.   Neurological: Negative.   Endo/Heme/Allergies: Negative.   Psychiatric/Behavioral: Negative for depression, suicidal ideas, hallucinations, memory loss and substance abuse. The patient is not nervous/anxious and does not have insomnia.     Blood pressure 99/67, pulse 82, temperature 97.3 F (36.3 C), temperature source Oral, resp. rate 16, height 5' 11.5" (1.816 m), weight 91.989 kg (202 lb 12.8 oz).Body mass index is 27.89 kg/(m^2).  General Appearance: Fairly Groomed  Patent attorney::  Good  Speech:  Clear and Coherent  Volume:  Normal  Mood:  Negative  Affect:  Appropriate  Thought Process:  Goal Directed  Orientation:  Full (Time, Place, and Person)  Thought Content:  Negative  Suicidal Thoughts:  No  Homicidal Thoughts:  No  Memory:  Immediate;   Fair Recent;   Fair Remote;   Fair  Judgement:  Fair  Insight:  Fair  Psychomotor Activity:  Normal  Concentration:  Fair  Recall:  [employer] of Knowledge:Good  Language: Fair  Akathisia:  No  Handed:  Right  AIMS (if indicated):     Assets:  Communication Skills Desire for Improvement Physical Health  Sleep:  Number of Hours: 6.5    Past Psychiatric History: Yes Diagnosis: MDD with psychosis   Hospitalizations: Denies  Outpatient Care: Non compliant after last admission   Substance Abuse Care: Denies  Self-Mutilation: Denies   Suicidal Attempts: Denies  Violent Behaviors: Potential when angry    Musculoskeletal: Strength & Muscle Tone: within normal limits Gait & Station: normal Patient leans: N/A  DSM5:  AXIS I: Major depressive disorder, single episode, severe, specified as with psychotic behavior  Intermittent explosive disorder  Cannabis use disorder  AXIS II: Cluster B Traits  AXIS III:  Past Medical History   Diagnosis  Date   .  None reported    AXIS IV: other psychosocial or environmental problems and problems related to social environment  AXIS V: 61-70 mild symptoms   Level of Care:  OP  Hospital Course:  Benjamin Butler is a 38 y.o. male who presents to the [facility] reporting depression and suicidal ideation. The patient was recently discharged from the [facility] on 12/19/13. Benjamin Butler admits that he did not go to follow up appointment so he was not able to continue on his medications. The patient is reporting stressors of breakup with long term girlfriend, stress from job at [employer], lack of regular family contact and homelessness. Patient states today during his psychiatric assessment "I told them I was not supposed to  be in a shelter. I have no money, no car, and I[place] living in the [place]. I am real depressed. I feel like tearing something up. I'm really on edge. I can't understand information. I was hearing voices this morning. They tell me to jump off a bridge. I have my mind made up to do that. My depression is a nine. Nobody is listening to me." Benjamin Butler becomes agitated at times during his assessment.  Towards the end he began to refuse to answer questions. His chart was reviewed to gain the necessary information to complete his history and physical.   Patient was admitted to the [facility] for further assessment and medication management. His Prozac was continued at 40 mg for depression, Seroquel was increased to 200 mg hs for insomnia/psychosis, and Trazodone 50 mg hs for insomnia. The patient endorsed high levels of depression and expressed the desire to jump from a bridge. Patient acted very paranoid on the unit. Benjamin Butler requested to speak to someone in management due to concerns that staff were engaging in inappropriate activities. He also became angry at staff after they prevented him from asking another patient to be his roommate. Benjamin Butler was clear in communicating that his main problem was living in the woods and that he had no place to stay. He continued to express frustration that no one could understand his struggles. Patient made requests that treatment team call [employer] to arrange a job transfer. He remained very suspicious of staff. Patient rated his levels of depression very high. However, he was active on the unit and noted to interact very well with his peers. Patient was compliant with his medications. Prior to his discharge the patient denied suicidal thoughts and auditory hallucinations. He reported to the social worker that he planned to [facility] to get a tent and sleeping bag. Patient was provided with bus passes to help with transportation. He was given prescriptions and a two week supply of medications. The patient will follow up with [facility]. [name] with [facility] returned to him.   Consults:  psychiatry  Significant Diagnostic Studies:  Labs WNL, UDS positive for marijuana   Discharge Vitals:   Blood pressure 99/67, pulse 82, temperature 97.3 F (36.3 C), temperature source Oral, resp. rate 16, height 5' 11.5" (1.816 m), weight 91.989 kg (202 lb 12.8  oz). Body mass index is 27.89 kg/(m^2). Lab Results:   No results found for this or any previous visit (from the past 72 hour(s)).  Physical Findings: AIMS: Facial and Oral Movements Muscles of Facial Expression: None, normal Lips and Perioral Area: None, normal Jaw: None, normal Tongue: None, normal,Extremity Movements Upper (arms, wrists, hands, fingers): None, normal Lower (legs, knees, ankles, toes): None, normal, Trunk Movements Neck, shoulders, hips: None, normal, Overall Severity Severity of abnormal movements (highest score from questions above): None, normal Incapacitation due to abnormal movements: None, normal Patient's awareness of abnormal movements (rate only patient's report): No Awareness, Dental Status Current problems with teeth and/or dentures?: No Does patient usually wear dentures?: No  CIWA:    COWS:     Psychiatric Specialty Exam: See Psychiatric Specialty Exam and Suicide Risk Assessment completed by Attending Physician prior to discharge.  Discharge destination:  Home  Is patient on multiple antipsychotic therapies at discharge:  No   Has Patient had three or more failed trials of antipsychotic monotherapy by history:  No  Recommended Plan for Multiple Antipsychotic Therapies: NA     Medication List  Indication   FLUoxetine 40 MG capsule  Commonly known as:  PROZAC  Take 1 capsule (40 mg total) by mouth daily.   Indication:  Depression     QUEtiapine 200 MG tablet  Commonly known as:  SEROQUEL  Take 1 tablet (200 mg total) by mouth at bedtime.   Indication:  Trouble Sleeping, Mood control     traZODone 50 MG tablet  Commonly known as:  DESYREL  Take 1 tablet (50 mg total) by mouth at bedtime.   Indication:  Trouble Sleeping           Follow-up Information   Follow up with [facility]. (Go to the walk-in clinc M-F between 8 and 9 AM for your hospital follow up appointment)    Contact information:   [address]  [place]  [336]  [phone]      Follow-up recommendations:   Activity: as tolerated  Diet: healthy  Tests: routine  Other: patient to keep his after care appointment  Comments:   Take all your medications as prescribed by your mental healthcare provider.  Report any adverse effects and or reactions from your medicines to your outpatient provider promptly.  Patient is instructed and cautioned to not engage in alcohol and or illegal drug use while on prescription medicines.  In the event of worsening symptoms, patient is instructed to call the crisis hotline, 911 and or go to the nearest ED for appropriate evaluation and treatment of symptoms.  Follow-up with your primary care provider for your other medical issues, concerns and or health care needs.   Total Discharge Time:  Greater than 30 minutes.  SignedFransisca Kaufmann NP-C 01/12/2014, 9:38 AM Patient seen, evaluated and I agree with notes by Nurse Practitioner. Thedore Mins, MD

## 2014-01-12 NOTE — [facility] Suicide Risk Assessment (Signed)
   Demographic Factors:  Male, Low socioeconomic status and Unemployed  Total Time spent with patient: 20 minutes  Psychiatric Specialty Exam: Physical Exam  Psychiatric: He has a normal mood and affect. His speech is normal and behavior is normal. Judgment and thought content normal. Cognition and memory are normal.    Review of Systems  Constitutional: Negative.   HENT: Negative.   Eyes: Negative.   Respiratory: Negative.   Cardiovascular: Negative.   Gastrointestinal: Negative.   Genitourinary: Negative.   Musculoskeletal: Negative.   Skin: Negative.   Neurological: Negative.   Endo/Heme/Allergies: Negative.   Psychiatric/Behavioral: Negative.     Blood pressure 99/67, pulse 82, temperature 97.3 F (36.3 C), temperature source Oral, resp. rate 16, height 5' 11.5" (1.816 m), weight 91.989 kg (202 lb 12.8 oz).Body mass index is 27.89 kg/(m^2).  General Appearance: Fairly Groomed  Patent attorneyye Contact::  Good  Speech:  Clear and Coherent  Volume:  Normal  Mood:  Negative  Affect:  Appropriate  Thought Process:  Goal Directed  Orientation:  Full (Time, Place, and Person)  Thought Content:  Negative  Suicidal Thoughts:  No  Homicidal Thoughts:  No  Memory:  Immediate;   Fair Recent;   Fair Remote;   Fair  Judgement:  Fair  Insight:  Fair  Psychomotor Activity:  Normal  Concentration:  Fair  Recall:  [employer]  Fund of Knowledge:Good  Language: Fair  Akathisia:  No  Handed:  Right  AIMS (if indicated):     Assets:  Communication Skills Desire for Improvement Physical Health  Sleep:  Number of Hours: 6.5    Musculoskeletal: Strength & Muscle Tone: within normal limits Gait & Station: normal Patient leans: N/A   Mental Status Per Nursing Assessment::   On Admission:  Suicidal ideation indicated by patient;Self-harm thoughts;Thoughts of violence towards others  Current Mental Status by Physician: patient denies suicide ideation, intent or plan  Loss Factors: Financial  problems/change in socioeconomic status  Historical Factors: Impulsivity  Risk Reduction Factors:   Sense of responsibility to family, Living with another person, especially a relative and Positive therapeutic relationship  Continued Clinical Symptoms:  Alcohol/Substance Abuse/Dependencies  Cognitive Features That Contribute To Risk:  Closed-mindedness    Suicide Risk:  Minimal: No identifiable suicidal ideation.  Patients presenting with no risk factors but with morbid ruminations; may be classified as minimal risk based on the severity of the depressive symptoms  Discharge Diagnoses:   AXIS I: Major depressive disorder, single episode, severe, specified as with psychotic behavior             Intermittent explosive disorder             Cannabis use disorder  AXIS II:  Cluster B Traits AXIS III:   Past Medical History  Diagnosis Date  . None reported    AXIS IV:  other psychosocial or environmental problems and problems related to social environment AXIS V:  61-70 mild symptoms  Plan Of Care/Follow-up recommendations:  Activity:  as tolerated Diet:  healthy Tests:  routine Other:  patient to keep his after care appointment  Is patient on multiple antipsychotic therapies at discharge:  No   Has Patient had three or more failed trials of antipsychotic monotherapy by history:  No  Recommended Plan for Multiple Antipsychotic Therapies: NA    Thedore MinsAkintayo, Ahrianna Siglin, MD 01/12/2014, 9:50 AM

## 2014-01-17 NOTE — Progress Notes (Signed)
Patient Discharge Instructions:  After Visit Summary (AVS):   Faxed to:  01/17/14 Discharge Summary Note:   Faxed to:  01/17/14 Psychiatric Admission Assessment Note:   Faxed to:  01/17/14 Suicide Risk Assessment - Discharge Assessment:   Faxed to:  01/17/14 Faxed/Sent to the Next Level Care provider:  01/17/14 [facility] @ [phone]  Jerelene ReddenSheena E Delta, 01/17/2014, 3:58 PM

## 2014-02-03 ENCOUNTER — Encounter (HOSPITAL_COMMUNITY): Payer: Self-pay | Admitting: Emergency Medicine

## 2014-02-03 ENCOUNTER — Emergency Department (HOSPITAL_COMMUNITY)
Admission: EM | Admit: 2014-02-03 | Discharge: 2014-02-03 | Disposition: A | Discharge status: Home / Self Care | Payer: Self-pay | Attending: Emergency Medicine | Admitting: Emergency Medicine

## 2014-02-03 DIAGNOSIS — R21 Rash and other nonspecific skin eruption: Secondary | ICD-10-CM | POA: Insufficient documentation

## 2014-02-03 DIAGNOSIS — F3289 Other specified depressive episodes: Secondary | ICD-10-CM | POA: Insufficient documentation

## 2014-02-03 DIAGNOSIS — F17200 Nicotine dependence, unspecified, uncomplicated: Secondary | ICD-10-CM | POA: Insufficient documentation

## 2014-02-03 DIAGNOSIS — F329 Major depressive disorder, single episode, unspecified: Secondary | ICD-10-CM | POA: Insufficient documentation

## 2014-02-03 DIAGNOSIS — Z79899 Other long term (current) drug therapy: Secondary | ICD-10-CM | POA: Insufficient documentation

## 2014-02-03 MED ORDER — HYDROCORTISONE 0.5 % EX CREA: 1.0000 "application " | TOPICAL_CREAM | Freq: Every day | CUTANEOUS | Status: DC

## 2014-02-03 NOTE — ED Notes (Signed)
Pt arrived at [phone].  Pt stated that he needed to go for coffee before being seen.  Pt seen off the unit.  Chart d/c after one hour holding and then pt returned to department.  Pt would like to be seen for rash.  Noted: Time delay of care for patient not being in lobby for triage.

## 2014-02-03 NOTE — ED Notes (Signed)
Pt seen 2 days ago for rash on lips.  Pt returning b/c bump noted on rt lower lip.

## 2014-02-03 NOTE — ED Provider Notes (Signed)
TIME SEEN: 7:59 AM  CHIEF COMPLAINT: Rash  HPI: Patient is a 38 y.o. M with history of depression who presents emergency department with a rash on his right lower lip. He states he has had a rash for 2 days and he's been using B[employer] Chapstick with no relief. He also reports he is having dry lips. No new exposures. No angioedema, difficulty swallowing or speaking, no shortness of breath or wheezing, no stridor.  ROS: See HPI Constitutional: no fever  Eyes: no drainage  ENT: no runny nose   Cardiovascular:  no chest pain  Resp: no SOB  GI: no vomiting GU: no dysuria Integumentary: no rash  Allergy: no hives  Musculoskeletal: no leg swelling  Neurological: no slurred speech ROS otherwise negative  PAST MEDICAL HISTORY/PAST SURGICAL HISTORY:  Past Medical History  Diagnosis Date  . Medical history non-contributory   . Depression     MEDICATIONS:  Prior to Admission medications   Medication Sig Start Date End Date Taking? Authorizing Provider  FLUoxetine (PROZAC) 40 MG capsule Take 1 capsule (40 mg total) by mouth daily. 01/12/14   Fransisca KaufmannLaura Davis, NP  QUEtiapine (SEROQUEL) 200 MG tablet Take 1 tablet (200 mg total) by mouth at bedtime. 01/12/14   Fransisca KaufmannLaura Davis, NP  traZODone (DESYREL) 50 MG tablet Take 1 tablet (50 mg total) by mouth at bedtime. 01/12/14   Fransisca KaufmannLaura Davis, NP    ALLERGIES:  No Known Allergies  SOCIAL HISTORY:  History  Substance Use Topics  . Smoking status: Heavy Tobacco Smoker -- 1.50 packs/day    Types: Cigarettes  . Smokeless tobacco: Never Used  . Alcohol Use: No    FAMILY HISTORY: History reviewed. No pertinent family history.  EXAM: BP 125/74  Pulse 88  Temp(Src) 97.5 F (36.4 C) (Oral)  Resp 18  SpO2 100% CONSTITUTIONAL: Alert and oriented and responds appropriately to questions. Well-appearing; well-nourished HEAD: Normocephalic EYES: Conjunctivae clear, PERRL ENT: normal nose; no rhinorrhea; moist mucous membranes; pharynx without lesions  noted NECK: Supple, no meningismus, no LAD; patient has 2 small papules to the lower lip with no drainage; no angioedema, no trismus or drooling CARD: RRR; S1 and S2 appreciated; no murmurs, no clicks, no rubs, no gallops RESP: Normal chest excursion without splinting or tachypnea; breath sounds clear and equal bilaterally; no wheezes, no rhonchi, no rales, no respiratory distress ABD/GI: Normal bowel sounds; non-distended; soft, non-tender, no rebound, no guarding BACK:  The back appears normal and is non-tender to palpation, there is no CVA tenderness EXT: Normal ROM in all joints; non-tender to palpation; no edema; normal capillary refill; no cyanosis    SKIN: Normal color for age and race; warm NEURO: Moves all extremities equally PSYCH: The patient's mood and manner are appropriate. Grooming and personal hygiene are appropriate. No SI, HI, hallucinations.  MEDICAL DECISION MAKING: Patient here with rash to his lower lip. Have discussed with patient that I am not concerned for any infectious etiology.  Patient states she was concerned he may have herpes. There are no vesicular lesions. Reassured patient that I do not believe this is herpes. He states he is also concerned these may be skin tags.  Discussed with patient I do not believe these are skin tags. No other involvement of mucous membranes. No urticaria. No respiratory involvement.  Airway is patent.  Have discussed with patient that he can't use a small amount of 0.5% hydrocortisone cream on these lesions that this may be some type of contact dermatitis.  Have discussed  supportive care instructions and return precautions. Patient verbalizes understanding and is comfortable with this plan.       Layla Maw Quaniyah Bugh, DO 02/03/14 [phone]

## 2014-02-03 NOTE — Discharge Instructions (Signed)
Rash A rash is a change in the color or texture of your skin. There are many different types of rashes. You may have other problems that accompany your rash.  The bumps on your lips may be due to an allergic reaction and dry skin but there is no sign of infection or skin tags. Continue to use chapstick without scents/flacors such as Carmex or Blistek.   CAUSES   Infections.  Allergic reactions. This can include allergies to pets or foods.  Certain medicines.  Exposure to certain chemicals, soaps, or cosmetics.  Heat.  Exposure to poisonous plants.  Tumors, both cancerous and noncancerous. SYMPTOMS   Redness.  Scaly skin.  Itchy skin.  Dry or cracked skin.  Bumps.  Blisters.  Pain. DIAGNOSIS  Your caregiver may do a physical exam to determine what type of rash you have. A skin sample (biopsy) may be taken and examined under a microscope. TREATMENT  Treatment depends on the type of rash you have. Your caregiver may prescribe certain medicines. For serious conditions, you may need to see a skin doctor (dermatologist). HOME CARE INSTRUCTIONS   Avoid the substance that caused your rash.  Do not scratch your rash. This can cause infection.  You may take cool baths to help stop itching.  Only take over-the-counter or prescription medicines as directed by your caregiver.  Keep all follow-up appointments as directed by your caregiver. SEEK IMMEDIATE MEDICAL CARE IF:  You have increasing pain, swelling, or redness.  You have a fever.  You have new or severe symptoms.  You have body aches, diarrhea, or vomiting.  Your rash is not better after 3 days. MAKE SURE YOU:  Understand these instructions.  Will watch your condition.  Will get help right away if you are not doing well or get worse. Document Released: 10/02/2002 Document Revised: 01/04/2012 Document Reviewed: 07/27/2011 [facility] Patient Information 2014 [employer] [place].

## 2014-02-17 ENCOUNTER — Encounter (HOSPITAL_COMMUNITY): Payer: Self-pay | Admitting: Emergency Medicine

## 2014-02-17 ENCOUNTER — Emergency Department (HOSPITAL_COMMUNITY)
Admission: EM | Admit: 2014-02-17 | Discharge: 2014-02-17 | Disposition: A | Discharge status: Home / Self Care | Payer: Self-pay | Attending: Emergency Medicine | Admitting: Emergency Medicine

## 2014-02-17 DIAGNOSIS — F3289 Other specified depressive episodes: Secondary | ICD-10-CM | POA: Insufficient documentation

## 2014-02-17 DIAGNOSIS — K089 Disorder of teeth and supporting structures, unspecified: Secondary | ICD-10-CM | POA: Insufficient documentation

## 2014-02-17 DIAGNOSIS — F17200 Nicotine dependence, unspecified, uncomplicated: Secondary | ICD-10-CM | POA: Insufficient documentation

## 2014-02-17 DIAGNOSIS — IMO0002 Reserved for concepts with insufficient information to code with codable children: Secondary | ICD-10-CM | POA: Insufficient documentation

## 2014-02-17 DIAGNOSIS — F329 Major depressive disorder, single episode, unspecified: Secondary | ICD-10-CM | POA: Insufficient documentation

## 2014-02-17 DIAGNOSIS — Z79899 Other long term (current) drug therapy: Secondary | ICD-10-CM | POA: Insufficient documentation

## 2014-02-17 DIAGNOSIS — K0889 Other specified disorders of teeth and supporting structures: Secondary | ICD-10-CM

## 2014-02-17 MED ORDER — PENICILLIN V POTASSIUM 500 MG PO TABS: 500.0000 mg | ORAL_TABLET | Freq: Three times a day (TID) | ORAL | Status: DC

## 2014-02-17 MED ORDER — OXYCODONE-ACETAMINOPHEN 5-325 MG PO TABS
2.0000 | ORAL_TABLET | Freq: Once | ORAL | Status: DC
  Filled 2014-02-17: qty 2

## 2014-02-17 MED ORDER — ONDANSETRON 4 MG PO TBDP
4.0000 mg | ORAL_TABLET | Freq: Once | ORAL | Status: DC
  Filled 2014-02-17: qty 1

## 2014-02-17 MED ORDER — OXYCODONE-ACETAMINOPHEN 5-325 MG PO TABS: 1.0000 | ORAL_TABLET | Freq: Four times a day (QID) | ORAL | Status: DC | PRN

## 2014-02-17 NOTE — Discharge Instructions (Signed)
You have been seen by your caregiver because of dental pain.  SEEK MEDICAL ATTENTION IF: The exam and treatment you received today has been provided on an emergency basis only. This is not a substitute for complete medical or dental care. If your problem worsens or new symptoms (problems) appear, and you are unable to arrange prompt follow-up care with your dentist, call or return to this location. CALL YOUR DENTIST OR RETURN IMMEDIATELY IF you develop a fever, rash, difficulty breathing or swallowing, neck or facial swelling, or other potentially serious concerns.  You have been diagnosed with Dental pain. Please call the follow up dentist first thing in the morning on Monday for a follow up appointment. Keep your discharge paperwork from today's visit to bring to the dentist office. You may also use the resource guide listed below to help you find a dentist if you do not already have one to followup with. It is very important that you get evaluated by a dentist as soon as possible.  Use your pain medication as prescribed and do not operate heavy machinery while on pain medication. Note that your pain medication contains acetaminophen (Tylenol) & its is not reccommended that you use additional acetaminophen (Tylenol) while taking this medication. Take your full course of antibiotics. Read the instructions below.  Eat a soft or liquid diet and rinse your mouth out after meals with warm water. You should see a dentist or return here at once if you have increased swelling, increased pain or uncontrolled bleeding from the site of your injury.   SEEK MEDICAL CARE IF:   You have increased pain not controlled with medicines.   You have swelling around your tooth, in your face or neck.   You have bleeding which starts, continues, or gets worse.   You have a fever >101  If you are unable to open your mouth Soft Diet  The soft diet may be recommended after you were put on a full liquid diet. A normal  diet may follow. The soft diet can also be used after surgery if you are too ill to keep down a normal diet. The soft diet may also be needed if you have a hard time chewing foods.  DESCRIPTION  Tender foods are used. Foods do not need to be ground or pureed. Most raw fruits and vegetables and coarse breads and cereals should be avoided. Fried foods and highly seasoned foods may cause discomfort.  NUTRITIONAL ADEQUACY  A healthy diet is possible if foods from each of the basic food groups are eaten daily.  SOFT DIET FOOD LISTS  Milk/Dairy  Allowed: Milk and milk drinks, milk shakes, cream cheese, cottage cheese, mild cheeses.  Avoid: Sharp or highly seasoned cheese. Meat/Meat Substitutes  Allowed: Broiled, roasted, baked, or stewed tender lean beef, mutton, lamb, veal, chicken, [place], liver, ham, crisp bacon, white fish, tuna, salmon. Eggs, smooth peanut butter.  Avoid: All fried meats, fish, or fowl. Rich gravies and sauces. Lunch meats, sausages, hot dogs. Meats with gristle, chunky peanut butter. Breads/Grains  Allowed: Rice, noodles, spaghetti, macaroni. Dry or cooked refined cereals, such as farina, cream of wheat, oatmeal, grits, whole-wheat cereals. Plain or toasted white or wheat blend or whole-grain breads, soda crackers or saltines, flour tortillas.  Avoid: Wild rice, coarse cereals, such as bran. Seed in or on breads and crackers. Bread or bread products with nuts or seeds. Fruits/Vegetables  Allowed: Fruit and vegetable juices, well-cooked or canned fruits and vegetables, any dried fruit. One citrus  fruit daily, 1 vitamin A source daily. Well-ripened, easy to chew fruits, sweet potatoes. Baked, boiled, mashed, creamed, scalloped, or au gratin potatoes. Broths or creamed soups made with allowed vegetables, strained tomatoes.  Avoid: All gas-forming vegetables (corn, radishes, Brussels sprouts, onions, broccoli, cabbage, parsnips, turnips, chili peppers, pinto beans, split peas, dried  beans). Fruits containing seeds and skin. Potato chips and corn chips. All others that are not made with allowed vegetables. Highly seasoned soups. Desserts/Sweets  Allowed: Simple desserts, such as custard, junkets, gelatin desserts, plain ice cream and sherbets, simple cakes and cookies, allowed fruits, sugar, syrup, jelly, honey, plain hard candy, and molasses.  Avoid: Rich pastries, any dessert containing dates, nuts, raisins, or coconut. Fried pastries, such as doughnuts. Chocolate. Beverages  Allowed: Fruit and vegetable juices. Caffeine-free carbonated drinks, coffee, and tea.  Avoid: Caffeinated beverages: coffee, tea, soda or pop. Miscellaneous  Allowed: Butter, cream, margarine, mayonnaise, oil. Cream sauces, salt, and mild spices.  Avoid: Highly spiced salad dressings. Highly seasoned foods, hot sauce, mustard, horseradish, and pepper. SAMPLE MENU  Breakfast  Orange juice.  Oatmeal.  Soft cooked egg.  Toast and margarine.  2% milk.  Coffee. Lunch  Meatloaf.  Mashed potato.  Green beans.  Lemon pudding.  Bread and margarine.  Coffee. Dinner  Consomm or apricot nectar.  Chicken breast.  Rice, peas, and carrots.  Applesauce.  Bread and margarine.  2% milk. To cut the amount of fat in your diet, omit margarine and use 1% or skim milk.  NUTRIENT ANALYSIS  Calories........................1953 Kcal.  Protein.........................102 gm.  Carbohydrate...............247 gm.  Fat................................65 gm.  Cholesterol...................449 mg.  Dietary fiber.................19 gm.  Vitamin A.....................2944 RE.  Vitamin C.....................79 mg.  Niacin..........................25 mg.  Riboflavin....................2.0 mg.  Thiamin.......................1.5 mg.  Folate..........................249 mcg.  Calcium.......................1030 mg.  Phosphorus.................1782 mg.  Zinc..............................12 mg.    Iron..............................13 mg.  Sodium.........................299 mg.  Potassium....................3046 mg. Document Released: 01/19/2008 Document Revised: 01/04/2012 Document Reviewed: 01/19/2008  [facility] Patient Information 2014 [place]   RESOURCE GUIDE   Dental Problems  Dr. Grandville SilosJanna Civilis $200 dollar visit [address] [place], [place] [address]  [phone]    Patients with Medicaid: [facility]                     [facility] [phone]                                           [phone] [address] Phone:  [phone]                                                  Phone:  [phone]  If unable to pay or uninsured, contact:  [facility] or [facility] to become qualified for the adult dental clinic.  Chronic Pain Problems Contact [facility]  [phone] Patients need to be referred by their primary care doctor.  Insufficient Money for Medicine Contact United Way:  call "211" or [facility] [phone].  No Primary Care Doctor Call [facility]  [phone] Other agencies that provide inexpensive medical care    [facility]  [phone]    [facility]  [phone]    [facility]  [phone]    Women's Clinic  [phone]    [facility]anned Parenthood  [phone]    [facility]  [phone]  Psychological Services [facility]  [phone] [facility]  [phone] [facility]   [phone] (emergency services [phone])  Substance Abuse Resources [facility]  [phone] [facility] [phone] [facility] [phone] [facility] [phone] Residential & Outpatient Substance Abuse Program  [phone]  Abuse/Neglect [facility] Hotline [phone] [facility] Hotline [phone] (After Hours)  Emergency Shelter [facility] [phone]  Maternity Homes Room at the [place] [facility] [place] [phone] [facility] [phone]  MRSA Hotline #:   (331)204-0609(954)564-1708    [facility] Resources  [facility]     [employer]                          [facility] [address]                       [address]      [address] [place] [address]  [place]                                                [place]                            [place] Phone:  [phone]                                   Phone:  [phone]                 Phone:  [phone]  [facility] Phone:  [phone]  [facility] Child Abuse Hotline [phone](336) [phone] (After Hours)

## 2014-02-17 NOTE — ED Notes (Signed)
Pt reports tooth pain that started 3 Days ago. Pt not sure which tooth hurts but it is on the LT side.

## 2014-02-17 NOTE — ED Provider Notes (Signed)
CSN: [id number]     Arrival date & time 02/17/14  0912 History  This chart was scribed for non-physician practitioner, Arthor CaptainAbigail Dannie Hattabaugh, PA-C, working with Rolland PorterMark James, MD by Shari HeritageAisha Amuda, ED Scribe. This patient was seen in room TR05C/TR05C and the patient's care was started at 10:13 AM.  Chief Complaint  Patient presents with  . Dental Pain    The history is provided by the patient. No language interpreter was used.    HPI Comments: Benjamin Butler is a 38 y.o. male who presents to the Emergency Department complaining of constant, severe, aching, left lower dental pain that began 3 days ago. He states that pain has progressively worsened since symptom onset and now radiates to his whole left face. Pain is worse with palpation of his face and when he bites down. He takes trazodone nightly and says this provides some relief, but he has not taken any pain medicines. He denies fever, chills, dysphagia, nausea or vomiting. Patient's medical history includes depression. Patient smokes cigarettes.   Past Medical History  Diagnosis Date  . Medical history non-contributory   . Depression    Past Surgical History  Procedure Laterality Date  . Acne cyst removal      back   History reviewed. No pertinent family history. History  Substance Use Topics  . Smoking status: Heavy Tobacco Smoker -- 1.50 packs/day    Types: Cigarettes  . Smokeless tobacco: Never Used  . Alcohol Use: No    Review of Systems  Constitutional: Negative for fever and chills.  HENT: Positive for dental problem. Negative for trouble swallowing.   Gastrointestinal: Negative for nausea and vomiting.    Allergies  Review of patient's allergies indicates no known allergies.  Home Medications   Prior to Admission medications   Medication Sig Start Date End Date Taking? Authorizing Provider  FLUoxetine (PROZAC) 40 MG capsule Take 1 capsule (40 mg total) by mouth daily. 01/12/14   Fransisca KaufmannLaura Davis, NP  hydrocortisone cream 0.5 %  Apply 1 application topically daily. For 3-5 days 02/03/14   Layla MawKristen N Ward, DO  QUEtiapine (SEROQUEL) 200 MG tablet Take 1 tablet (200 mg total) by mouth at bedtime. 01/12/14   Fransisca KaufmannLaura Davis, NP  traZODone (DESYREL) 50 MG tablet Take 1 tablet (50 mg total) by mouth at bedtime. 01/12/14   Fransisca KaufmannLaura Davis, NP   Triage Vitals: BP 124/81  Pulse 80  Temp(Src) 98.4 F (36.9 C) (Oral)  Resp 15  Ht 6' (1.829 m)  Wt 210 lb (95.255 kg)  BMI 28.47 kg/m2  SpO2 99% Physical Exam  Constitutional: He is oriented to person, place, and time. He appears well-developed and well-nourished. No distress.  HENT:  Head: Normocephalic and atraumatic.  Mouth/Throat: Oropharynx is clear and moist. No oropharyngeal exudate.  Tender to palpation in 3rd molar on the left lower side. Tenderness in the gum, but no induration or fluctuance. No fractures in the teeth. No obvious caries.  Eyes: Conjunctivae and EOM are normal.  Neck: Normal range of motion. Neck supple.  Cardiovascular: Normal rate.   Pulmonary/Chest: Effort normal.  Musculoskeletal: Normal range of motion.  Neurological: He is alert and oriented to person, place, and time.  Skin: Skin is warm and dry.  Psychiatric: He has a normal mood and affect. His behavior is normal.    ED Course  Procedures (including critical care time) DIAGNOSTIC STUDIES: Oxygen Saturation is 99% on room air, normal by my interpretation.    COORDINATION OF CARE: 10:16 AM- Performed dental  block and order Percocet and Zofran for pain control in the ED. Will consult care management regarding medication options because patient cannot afford prescriptions. Patient informed of current plan for treatment and evaluation and agrees with plan at this time.  10:33 AM - Spoke with case manager who will provide assistance in patient accessing medications.  Dental Performed by: Arthor CaptainHARRIS, Rima Blizzard Authorized by: Arthor CaptainHARRIS, Leigha Olberding Consent: Verbal consent obtained. Patient understanding: patient  states understanding of the procedure being performed Patient identity confirmed: verbally with patient Local anesthesia used: yes Local anesthetic: bupivacaine 0.5% with epinephrine Anesthetic total: 0.4 ml Patient sedated: no Patient tolerance: Patient tolerated the procedure well with no immediate complications.    MDM   Final diagnoses:  Pain, dental   Patient seen by case manager we were able to coordinate care for the patient to get him both pain medication and antibiotics for his teeth.  Patient has been referred to gait at community health and [facility] he will also followup with dentist as directed.  Patient with toothache.  No gross abscess.  Exam unconcerning for Ludwig's angina or spread of infection.  Will treat with penicillin and pain medicine.  Urged patient to follow-up with dentist.     I personally performed the services described in this documentation, which was scribed in my presence. The recorded information has been reviewed and is accurate.     Arthor Captainbigail Micaella Gitto, PA-C 02/17/14 1131

## 2014-02-17 NOTE — Progress Notes (Signed)
ED CM consulted to meet with patient in TR5, concerning medication assistance. [facility]. Pt has had 4 visits to the ED in the past 6 months. Pt does noted without PCP or health insurance. Met with patient at bedside, confirmed information. Pt stated that he does not have a PCP, or health insurance and he is homeless.  Pt has used the [facility] program within the last year and is not eligible for [facility]. Discussed the [facility] free Antibiotics program, and medication savings card  for pain meds. Pt verbalizes understanding and agreeable with discharge plan. Pt states, he can get to [facility] on [employer].  Savings card given and $8.00 to redeem at [facility] ,provided by an anonymous donor. Notified HT that patient is on his way to expect him. Pt verbalized appreciation for the assistance. Resources given for the [facility]. Offered to assist with establishing care at the [facility], with SDA. Pt states, "I am  really trying to get well", Offered to notify [facility] CSW [place] pt agreeable to meeting with him. No furthe ED CM needs identified

## 2014-02-26 NOTE — ED Provider Notes (Signed)
Medical screening examination/treatment/procedure(s) were performed by non-physician practitioner and as supervising physician I was immediately available for consultation/collaboration.   EKG Interpretation None        Leiah Giannotti, MD 02/26/14 2204 

## 2014-05-16 ENCOUNTER — Encounter (HOSPITAL_COMMUNITY): Payer: Self-pay | Admitting: Emergency Medicine

## 2014-05-16 ENCOUNTER — Emergency Department (HOSPITAL_COMMUNITY)
Admission: EM | Admit: 2014-05-16 | Discharge: 2014-05-16 | Disposition: A | Discharge status: Home / Self Care | Payer: Self-pay | Attending: Emergency Medicine | Admitting: Emergency Medicine

## 2014-05-16 DIAGNOSIS — Z792 Long term (current) use of antibiotics: Secondary | ICD-10-CM | POA: Insufficient documentation

## 2014-05-16 DIAGNOSIS — F17200 Nicotine dependence, unspecified, uncomplicated: Secondary | ICD-10-CM | POA: Insufficient documentation

## 2014-05-16 DIAGNOSIS — R21 Rash and other nonspecific skin eruption: Secondary | ICD-10-CM

## 2014-05-16 DIAGNOSIS — S30860A Insect bite (nonvenomous) of lower back and pelvis, initial encounter: Secondary | ICD-10-CM | POA: Insufficient documentation

## 2014-05-16 DIAGNOSIS — S90569A Insect bite (nonvenomous), unspecified ankle, initial encounter: Secondary | ICD-10-CM | POA: Insufficient documentation

## 2014-05-16 DIAGNOSIS — F319 Bipolar disorder, unspecified: Secondary | ICD-10-CM | POA: Insufficient documentation

## 2014-05-16 DIAGNOSIS — Z79899 Other long term (current) drug therapy: Secondary | ICD-10-CM | POA: Insufficient documentation

## 2014-05-16 DIAGNOSIS — Y9389 Activity, other specified: Secondary | ICD-10-CM | POA: Insufficient documentation

## 2014-05-16 DIAGNOSIS — W57XXXA Bitten or stung by nonvenomous insect and other nonvenomous arthropods, initial encounter: Secondary | ICD-10-CM | POA: Insufficient documentation

## 2014-05-16 DIAGNOSIS — Y9289 Other specified places as the place of occurrence of the external cause: Secondary | ICD-10-CM | POA: Insufficient documentation

## 2014-05-16 HISTORY — DX: Bipolar disorder, unspecified: F31.9

## 2014-05-16 LAB — RPR

## 2014-05-16 LAB — HIV ANTIBODY (ROUTINE TESTING W REFLEX): HIV 1&2 Ab, 4th Generation: NONREACTIVE

## 2014-05-16 MED ORDER — HYDROXYZINE HCL 25 MG PO TABS: 25.0000 mg | ORAL_TABLET | Freq: Four times a day (QID) | ORAL | Status: DC

## 2014-05-16 MED ORDER — CEPHALEXIN 500 MG PO CAPS: 500.0000 mg | ORAL_CAPSULE | Freq: Three times a day (TID) | ORAL | Status: DC

## 2014-05-16 MED ORDER — SULFAMETHOXAZOLE-TRIMETHOPRIM 800-160 MG PO TABS: 1.0000 | ORAL_TABLET | Freq: Two times a day (BID) | ORAL | Status: AC

## 2014-05-16 NOTE — Progress Notes (Signed)
Provided pt with bus passes for transportation and number for [facility] where he is on the wait list for shelter.  Pt is currently living in his cousin's shed, waiting to be approved for disability.  [facility] and gets them filled at the family medicine clinic on [address].  Emotional support and encouragement provided.

## 2014-05-16 NOTE — ED Notes (Addendum)
The patient said he has been sleeping in his cousin's shed and he thinks something crawled up his pants and bit him.  The patient said the insect bit him on his left leg, left foot  and his penis.  He rates his pain 6/10.  He denies discharge, or any urinary symptoms.

## 2014-05-16 NOTE — Progress Notes (Signed)
  CARE MANAGEMENT ED NOTE 05/16/2014  Patient:  Benjamin Butler,Benjamin Butler   Account Number:  [id number]  Date Initiated:  05/16/2014  Documentation initiated by:  Ferdinand CavaSCHETTINO,Jacquan Savas  Subjective/Objective Assessment:   38 yo male presenting to the ED with insect bites     Subjective/Objective Assessment Detail:     Action/Plan:   [facility] resources and follow up medical care   Action/Plan Detail:   Anticipated DC Date:       Status Recommendation to Physician:   Result of Recommendation:  Agreed    DC Planning Services  CM consult  Medication Assistance    Choice offered to / List presented to:            Status of service:  Completed, signed off  ED Comments:   ED Comments Detail:  CM consulted to assist with medications. CM reviewed patients chart and noted that patient has been MATCHed with over ride in past so [facility] to [facility]. This CM spoke with the patient and [facility]. The patient stated that he is a patient of [facility], he stated that he sees the NP there and [facility] and there is no charge. This CM encouraged the patient to follow up the next day at the [facility] to have the ED discharge paper work reviewed by the NP and also to have the prescriptions okayed to have [facility] so he can obtain [facility] at [facility].

## 2014-05-16 NOTE — ED Notes (Addendum)
Called case management with no answer.  Left message for callback.   [facility] [place], but needs additional help, if available.  Message left with Sue Lushndrea to give Lennox LaityJodi a message to come by FT when they get out of meeting.  Estimated time they will be able to come by FT is around 1500.

## 2014-05-16 NOTE — ED Provider Notes (Signed)
CSN: [id number]     Arrival date & time 05/16/14  1059 History  This chart was scribed for non-physician practitioner, Clabe SealLauren M Shawnette Augello, PA-C, working with Layla MawKristen N Ward, DO by Charline BillsEssence Howell, ED Scribe. This patient was seen in room TR10C/TR10C and the patient's care was started at 1:15 PM.   Chief Complaint  Patient presents with  . Insect Bite    The patient said he has been sleeping in his cousin's shed and he thinks something crawled up his pants and bit him.   HPI Comments: Benjamin Butler is a 38 y.o. male who presents to the Emergency Department complaining of rash noted 1 week ago. Pt states that he has been sleeping in his cousin's shed, due to homelessness and multiple people living in the home and no where to sleep, and felt something crawling up his leg before being bitten. Pt reports suspected insect bites to L leg, bilateral knees and penis noted 1.5 weeks ago. He denies testicular pain and testicular swelling. Reports full STD evaluation earlier this year, denies sexual intercourse since.No h/o DM or any immunocompromised diseases. NO PCP  The history is provided by the patient. No language interpreter was used.   Past Medical History  Diagnosis Date  . Medical history non-contributory   . Depression   . Bipolar 1 disorder    Past Surgical History  Procedure Laterality Date  . Acne cyst removal      back   History reviewed. No pertinent family history. History  Substance Use Topics  . Smoking status: Heavy Tobacco Smoker -- 1.50 packs/day    Types: Cigarettes  . Smokeless tobacco: Never Used  . Alcohol Use: No    Review of Systems  Constitutional: Negative for fever and chills.  Genitourinary: Positive for genital sores. Negative for dysuria, discharge, penile swelling, scrotal swelling, penile pain and testicular pain.  Skin: Positive for rash.  All other systems reviewed and are negative.  Allergies  Review of patient's allergies indicates no known  allergies.  Home Medications   Prior to Admission medications   Medication Sig Start Date End Date Taking? Authorizing Provider  FLUoxetine (PROZAC) 40 MG capsule Take 1 capsule (40 mg total) by mouth daily. 01/12/14   Fransisca KaufmannLaura Davis, NP  oxyCODONE-acetaminophen (PERCOCET) 5-325 MG per tablet Take 1-2 tablets by mouth every 6 (six) hours as needed. 02/17/14   Arthor CaptainAbigail Harris, PA-C  penicillin v potassium (VEETID) 500 MG tablet Take 1 tablet (500 mg total) by mouth 3 (three) times daily. 02/17/14   Arthor CaptainAbigail Harris, PA-C  QUEtiapine (SEROQUEL) 200 MG tablet Take 1 tablet (200 mg total) by mouth at bedtime. 01/12/14   Fransisca KaufmannLaura Davis, NP  traZODone (DESYREL) 50 MG tablet Take 1 tablet (50 mg total) by mouth at bedtime. 01/12/14   Fransisca KaufmannLaura Davis, NP   Triage Vitals: BP 108/71  Pulse 53  Temp(Src) 98.6 F (37 C) (Oral)  Resp 18  SpO2 99% Physical Exam  Nursing note and vitals reviewed. Constitutional: He is oriented to person, place, and time. He appears well-developed and well-nourished.  Non-toxic appearance. He does not have a sickly appearance. He does not appear ill. No distress.  HENT:  Head: Normocephalic and atraumatic.  Eyes: Conjunctivae and EOM are normal.  Neck: Neck supple.  Pulmonary/Chest: Effort normal.  Genitourinary: Right testis shows no mass, no swelling and no tenderness. Left testis shows no mass, no swelling and no tenderness. No penile erythema or penile tenderness. No discharge found.  Central penile lesion with  serosanguinous drainage. No vesicles or chancer noted. No surrounding erythema.  Musculoskeletal: Normal range of motion.  Lymphadenopathy:       Right: No inguinal adenopathy present.       Left: No inguinal adenopathy present.  Neurological: He is alert and oriented to person, place, and time.  Skin: Skin is warm and dry. He is not diaphoretic.  Multiple draining lesions to bilateral lower extremities with surrounding erythema.  Serosanguinous drainage  Noted. Multiple  hyperpigmented lesions to bilateral soles of feet. No lesions noted to hands.  Psychiatric: He has a normal mood and affect. His behavior is normal.   ED Course  Procedures (including critical care time) DIAGNOSTIC STUDIES: Oxygen Saturation is 99% on RA, normal by my interpretation.    COORDINATION OF CARE: 1:21 PM-Discussed treatment plan which includes diagnostic lab work with pt at bedside and pt agreed to plan.   Labs Review Labs Reviewed  RPR  HIV ANTIBODY (ROUTINE TESTING)    Imaging Review No results found.   EKG Interpretation None      MDM   Final diagnoses:  Insect bites  Rash   Pt presents with multiple lesions concerning for secondary infection. 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 Lesion to penis, not herpetic or chancer. Negative RPR 11/2013. Advised pt to follow up with dermatology for further evaluation of rash. Contacted case management for medication. Discussed lab results, imaging results, and treatment plan with the patient. Return precautions given. Reports understanding and no other concerns at this time.  Patient is stable for discharge at this time.  Meds given in ED:  Medications - No data to display  Discharge Medication List as of 05/16/2014  2:16 PM    START taking these medications   Details  cephALEXin (KEFLEX) 500 MG capsule Take 1 capsule (500 mg total) by mouth 3 (three) times daily., Starting 05/16/2014, Until Discontinued, Print    hydrOXYzine (ATARAX/VISTARIL) 25 MG tablet Take 1 tablet (25 mg total) by mouth every 6 (six) hours., Starting 05/16/2014, Until Discontinued, Print    sulfamethoxazole-trimethoprim (BACTRIM DS,SEPTRA DS) 800-160 MG per tablet Take 1 tablet by mouth 2 (two) times daily., Starting 05/16/2014, Last dose on Wed 05/23/14, Print       I personally performed the services described in this documentation, which was scribed in my presence. The recorded information has  been reviewed and is accurate.    Clabe Seal, PA-C 05/17/14 [phone]

## 2014-05-16 NOTE — Discharge Instructions (Signed)
Call for a follow up appointment with a Family or Primary Care Provider.  Follow up with a dermatologist for further evaluation of your rash. Return if Symptoms worsen.   Take medication as prescribed.  Avoid scratching the lesions on your legs and penis. Wash with a mild soap blot dry.   Emergency Department Resource Guide 1) Find a Doctor and Pay Out of Pocket Although you won't have to find out who is covered by your insurance plan, it is a good idea to ask around and get recommendations. You will then need to call the office and see if the doctor you have chosen will accept you as a new patient and what types of options they offer for patients who are self-pay. Some doctors offer discounts or will set up payment plans for their patients who do not have insurance, but you will need to ask so you aren't surprised when you get to your appointment.  2) Contact Your Local Health Department Not all health departments have doctors that can see patients for sick visits, but many do, so it is worth a call to see if yours does. If you don't know where your local health department is, you can check in your phone book. The CDC also has a tool to help you locate your state's health department, and many state websites also have listings of all of their local health departments.  3) Find a Walk-in Clinic If your illness is not likely to be very severe or complicated, you may want to try a walk in clinic. These are popping up all over the country in pharmacies, drugstores, and shopping centers. They're usually staffed by nurse practitioners or physician assistants that have been trained to treat common illnesses and complaints. They're usually fairly quick and inexpensive. However, if you have serious medical issues or chronic medical problems, these are probably not your best option.  No Primary Care Doctor: - Call Health Connect at  [phone] - they can help you locate a primary care doctor that  accepts your  insurance, provides certain services, etc. - Physician Referral Service- [phone]  Chronic Pain Problems: Organization         Address  Phone   Notes  [facility]  [phone] Patients need to be referred by their primary care doctor.   Medication Assistance: Organization         Address  Phone   Notes  [facility] [address] [place], [place] [address] [phone]) [phone] --Must be a resident of [place] -- Must have NO insurance coverage whatsoever (no Medicaid/ Medicare, etc.) -- The pt. MUST have a primary care doctor that directs their care regularly and follows them in the community   [facility]ist  [phone]   [employer]  [phone]) [phone]    Agencies that provide inexpensive medical care: Organization         Address  Phone   Notes  [facility]  [phone]) [phone]   [facility]    [phone]   [facility] [address] [place], [place] [address] [phone]   [facility] [address], [place] [phone]) [phone]   [facility]    [phone]   [facility]    [phone]) [phone]   [facility]  [address], [place] Phone:  [phone], Fax:  [phone]) [phone] Hours of Operation:  9 am - 6 pm, M-F.  Also accepts Medicaid/Medicare and self-pay.  °[facility] ° [address], [place] Phone: [phone], Fax: [phone]. Hours of Operation:  8:30 am - 5:30 pm, M-F.  Also accepts Medicaid and self-pay.  °[facility] [place] [address], [place] Phone: ([phone]   °[facility] [address], [place], [place] [phone], Ext. 123 Mondays & Thursdays: 7-9 AM.  First 15 patients are seen on a first come, first serve basis. °  ° °Medicaid-accepting [place] Providers: ° °Organization          Address  Phone   Notes  °[facility] [address], [place] [phone] Also accepts self-pay patients.  °[facility] [address], [place] ° ([phone]) [phone]   °[facility] [address], [place] [phone]) [phone]   °[facility] [address] [place] [address], [place] ([phone]) [phone]   °Veita Bland [address], [place]  ° ([phone]) [phone] Only accepts [place] Access Medicaid patients after they have their name applied to their card.  ° °Self-Pay (no insurance) in [place]: ° °Organization         Address  Phone   Notes  °Sickle Cell Patients, [facility] [address], [place] ([phone]) [phone]   °[facility] [address], [place] ([phone]) [phone]   °[facility] [place] ° [address], [place] [phone]) [phone]   °[facility]/Dr. Osei-Bonsu ° [address] [place] [address], [place] or [address], [place] [phone]) [phone] Phone number for both [place] and [place] locations is the same.  °[facility] [address], [place] ([phone]) [phone]   °[facility] [address] [place] [address], [place] or [address] ([phone]) [phone] °([phone]) [phone]   °[facility] [address], [place] ([phone]) [phone], phone; ([phone], fax Sees patients 1st and 3rd Saturday of every month.  Must not qualify for public or private insurance (i.e. Medicaid, Medicare, [place] Health Choice, Veterans' Benefits) • Household income should be no more than 200% of the poverty level •The clinic cannot treat you if you are pregnant or think you are pregnant • Sexually transmitted diseases are not treated at the clinic.  ° ° °Dental Care: °Organization         Address  Phone  Notes  °[place] [facility] [address],  [place] ([phone]) [phone] Accepts children up to age 21 who are enrolled in Medicaid or [place] Health Choice; pregnant women with a Medicaid card; and children who have applied for Medicaid or [place] Health Choice, but were declined, whose parents can pay a reduced fee at time of service.  °[place] [facility] [place]  [address], [place] ([phone]) [phone] Accepts children up to age 21 who are enrolled in Medicaid or [place] Health Choice; pregnant women with a Medicaid card; and children who have applied for Medicaid or  Health Choice, but were declined, whose parents can pay a reduced fee at time of service.  °[facility] ° [address], [place] ([phone]) [phone] Patients are seen by appointment only. Walk-ins are not accepted. [facility] will see patients 18 years   of age and older. °Monday - Tuesday (8am-5pm) °Most Wednesdays (8:30-5pm) °$30 per visit, cash only  °[facility] ° [address], [place] ([phone]) [phone] Patients are seen by appointment only. Walk-ins are not accepted. [facility] will see patients 18 years of age and older. °One Wednesday Evening (Monthly: Volunteer Based).  $30 per visit, cash only  °[facility]  ([phone]) [phone] for adults; Children under age 4, call Graduate Pediatric Dentistry at [phone]. Children aged 4-14, please call [phone] to request a pediatric application. ° Dental services are provided in all areas of dental care including fillings, crowns and bridges, complete and partial dentures, implants, gum treatment, root canals, and extractions. Preventive care is also provided. Treatment is provided to both adults and children. °Patients are selected via a lottery and there is often a waiting list. °  °[facility] [address], °[place] ° ([phone]) [phone] [email or web address] °  °[facility] [address], [place], [place]  (336)[phone], Ext. 123 Second and Fourth Thursday of each month, opens at 6:30 AM; Clinic ends at 9 AM.  Patients are seen on a first-come first-served basis, and a limited number are seen during each clinic.  ° °[facility] ° [address], [place], [place] ([phone]) [phone]   Eligibility Requirements °You must have lived in [place], [place], or [place] counties for at least the last three months. °  You cannot be eligible for state or federal sponsored healthcare insurance, including Veterans Administration, Medicaid, or Medicare. °  You generally cannot be eligible for healthcare insurance through your employer.  °  How to apply: °Eligibility screenings are held every Tuesday and Wednesday afternoon from 1:00 pm until 4:00 pm. You do not need an appointment for the interview!  °[facility] [address], [place], [place] [phone]   °[facility]  336-342-8273   °[facility]  336-703-3100   °[facility]  336-570-6415   ° °Behavioral Health Resources in the Community: °Intensive Outpatient Programs °Organization         Address  Phone  Notes  °[facility] [address], [place], [place] [phone]   °[facility] [address], [place], [place] 336-832-9800   °[facility] [address], [place], [place] ° 336-882-2125   °[facility] [address],  °[place], [place] 1-800-853-5163 or 336-641-4981   °Substance Abuse Resources °Organization         Address  Phone  Notes  °[facility]  336-882-2125   °[facility]  336-784-9470   °[facility]  336-285-9073   °[facility]  336-845-3988   °Residential & Outpatient Substance Abuse Program  1-800-659-3381   °Psychological Services °Organization         Address  Phone  Notes  °[facility]  336- 832-9600   °[facility]  336- 378-7881    °[facility] [address], [place] 1-800-853-5163 or 336-641-4981   ° °Mobile Crisis Teams °Organization         Address  Phone  Notes  °[facility], Mobile Crisis Care Unit  1-877-626-1772   °Assertive °[facility] ° [address] [place], [place] 336-834-9664   °Sharon DeEsch [address] °[place]  336-554-5454   ° °Self-Help/Support Groups °Organization           Address  Phone             Notes  [place]. of [place] - variety of support groups  [place] Call for more information  Narcotics Anonymous (NA), [facility] [address], [employer] [place]  2 meetings at this location   Special educational needs teacher         Address  Phone  Notes  [facility] [place],    [place]  1-(224)335-7018   [facility] [place] [facility]  [address] [place] [address], [place], [place]   [place], [place] [phone] Admissions: 8am-3pm M-F  Incent[facility] Substance [place] [address]    [place], [place] [phone]   [facility] [address] [place], [place], [place]   [facility] [address]  [place], [place]   [facility] - Intensive Outpatient [address], [place], [place]   [facility] ([place] [address]  [place], [place] 1-360-145-2736 or 585-331-6832   [facility] ([facility]) [address] [place], [place] Accepts Medicaid  [facility] [address]  [place] 1-226-409-9984 Substance Abuse/Addiction Treatment   [facility]         Address  Phone  Notes  [facility]  7097276150   Domenic Schwab, PhD [address] Arlis [place], [place]   (731) 107-6134 or (505) [phone]   [place], [place] (413)[phone]   [facility] [address], [place], [place] 540-398-4394 Insurance/Medicaid/sponsorship through [facility] and Families [address] [place]                                    [place], [place] 214-863-8819 [place] [address], [place] [phone]    Dr. Adele Schilder  ([phone]   [facility] Dept. 1) [address] [place] 2) [place] 3)  [address], [place] [phone]  [phone]   [place] [phone] or [phone] (After Hours)

## 2014-05-17 NOTE — ED Provider Notes (Signed)
Medical screening examination/treatment/procedure(s) were performed by non-physician practitioner and as supervising physician I was immediately available for consultation/collaboration.   EKG Interpretation None        Elyna Pangilinan N Tarini Carrier, DO 05/17/14 1101 

## 2014-08-26 DIAGNOSIS — M79604 Pain in right leg: Secondary | ICD-10-CM | POA: Insufficient documentation

## 2014-08-26 DIAGNOSIS — Z79899 Other long term (current) drug therapy: Secondary | ICD-10-CM | POA: Insufficient documentation

## 2014-08-26 DIAGNOSIS — Z792 Long term (current) use of antibiotics: Secondary | ICD-10-CM | POA: Insufficient documentation

## 2014-08-26 DIAGNOSIS — Z720 Tobacco use: Secondary | ICD-10-CM | POA: Insufficient documentation

## 2014-08-26 DIAGNOSIS — F319 Bipolar disorder, unspecified: Secondary | ICD-10-CM | POA: Insufficient documentation

## 2014-08-26 DIAGNOSIS — Z7952 Long term (current) use of systemic steroids: Secondary | ICD-10-CM | POA: Insufficient documentation

## 2014-08-27 ENCOUNTER — Encounter (HOSPITAL_COMMUNITY): Payer: Self-pay | Admitting: Emergency Medicine

## 2014-08-27 ENCOUNTER — Emergency Department (HOSPITAL_COMMUNITY)
Admission: EM | Admit: 2014-08-27 | Discharge: 2014-08-27 | Disposition: A | Discharge status: Home / Self Care | Payer: Self-pay | Attending: Emergency Medicine | Admitting: Emergency Medicine

## 2014-08-27 DIAGNOSIS — M79604 Pain in right leg: Secondary | ICD-10-CM

## 2014-08-27 MED ORDER — IBUPROFEN 200 MG PO TABS
600.0000 mg | ORAL_TABLET | Freq: Once | ORAL | Status: AC
  Administered 2014-08-27: 600 mg via ORAL
  Filled 2014-08-27: qty 3

## 2014-08-27 MED ORDER — OXYCODONE-ACETAMINOPHEN 5-325 MG PO TABS
2.0000 | ORAL_TABLET | Freq: Once | ORAL | Status: AC
  Administered 2014-08-27: 2 via ORAL
  Filled 2014-08-27: qty 2

## 2014-08-27 NOTE — ED Notes (Signed)
Bed: WA03 Expected date:  Expected time:  Means of arrival:  Comments: 

## 2014-08-27 NOTE — Discharge Instructions (Signed)
Musculoskeletal Pain °Musculoskeletal pain is muscle and boney aches and pains. These pains can occur in any part of the body. Your caregiver may treat you without knowing the cause of the pain. They may treat you if blood or urine tests, X-rays, and other tests were normal.  °CAUSES °There is often not a definite cause or reason for these pains. These pains may be caused by a type of germ (virus). The discomfort may also come from overuse. Overuse includes working out too hard when your body is not fit. Boney aches also come from weather changes. Bone is sensitive to atmospheric pressure changes. °HOME CARE INSTRUCTIONS  °· Ask when your test results will be ready. Make sure you get your test results. °· Only take over-the-counter or prescription medicines for pain, discomfort, or fever as directed by your caregiver. If you were given medications for your condition, do not drive, operate machinery or power tools, or sign legal documents for 24 hours. Do not drink alcohol. Do not take sleeping pills or other medications that may interfere with treatment. °· Continue all activities unless the activities cause more pain. When the pain lessens, slowly resume normal activities. Gradually increase the intensity and duration of the activities or exercise. °· During periods of severe pain, bed rest may be helpful. Lay or sit in any position that is comfortable. °· Putting ice on the injured area. °¨ Put ice in a bag. °¨ Place a towel between your skin and the bag. °¨ Leave the ice on for 15 to 20 minutes, 3 to 4 times a day. °· Follow up with your caregiver for continued problems and no reason can be found for the pain. If the pain becomes worse or does not go away, it may be necessary to repeat tests or do additional testing. Your caregiver may need to look further for a possible cause. °SEEK IMMEDIATE MEDICAL CARE IF: °· You have pain that is getting worse and is not relieved by medications. °· You develop chest pain  that is associated with shortness or breath, sweating, feeling sick to your stomach (nauseous), or throw up (vomit). °· Your pain becomes localized to the abdomen. °· You develop any new symptoms that seem different or that concern you. °MAKE SURE YOU:  °· Understand these instructions. °· Will watch your condition. °· Will get help right away if you are not doing well or get worse. °Document Released: 10/12/2005 Document Revised: 01/04/2012 Document Reviewed: 06/16/2013 °ExitCare® Patient Information ©2015 [employer]. This information is not intended to replace advice given to you by your health care provider. Make sure you discuss any questions you have with your health care provider. ° ° ° ° °Emergency Department Resource Guide °1) Find a Doctor and Pay Out of Pocket °Although you won't have to find out who is covered by your insurance plan, it is a good idea to ask around and get recommendations. You will then need to call the office and see if the doctor you have chosen will accept you as a new patient and what types of options they offer for patients who are self-pay. Some doctors offer discounts or will set up payment plans for their patients who do not have insurance, but you will need to ask so you aren't surprised when you get to your appointment. ° °2) Contact Your Local Health Department °Not all health departments have doctors that can see patients for sick visits, but many do, so it is worth a call to see if   yours does. If you don't know where your local health department is, you can check in your phone book. The CDC also has a tool to help you locate your state's health department, and many state websites also have listings of all of their local health departments. ° °3) Find a Walk-in Clinic °If your illness is not likely to be very severe or complicated, you may want to try a walk in clinic. These are popping up all over the country in pharmacies, drugstores, and shopping centers. They're usually  staffed by nurse practitioners or physician assistants that have been trained to treat common illnesses and complaints. They're usually fairly quick and inexpensive. However, if you have serious medical issues or chronic medical problems, these are probably not your best option. ° °No Primary Care Doctor: °- Call [facility] at  832-8000 - they can help you locate a primary care doctor that  accepts your insurance, provides certain services, etc. °- Physician Referral Service- 1-800-533-3463 ° °Chronic Pain Problems: °Organization         Address  Phone   Notes  °[facility]  (336) 297-2271 Patients need to be referred by their primary care doctor.  ° °Medication Assistance: °Organization         Address  Phone   Notes  °[place] [facility] [address] °[place], [place] [address] (336) 641-8030 --Must be a resident of [place] °-- Must have NO insurance coverage whatsoever (no Medicaid/ Medicare, etc.) °-- The pt. MUST have a primary care doctor that directs their care regularly and follows them in the community °  °[facility]  (866) 331-1348   °[employer]  (888) 892-1162   ° °Agencies that provide inexpensive medical care: °Organization         Address  Phone   Notes  °[facility]  (336) 832-8035   °[facility]    (336) 832-7272   °[facility] [address] °[place], [place] [address] (336) 832-4777   °[facility] [address], °[place] (336) 271-4999   °[facility]    (336) 373-0678   °[facility]    (336) 272-1050   °[facility] ° [address], [place] Phone:  (336) 832-4444, Fax:  (336) 832-4440 Hours of Operation:  9 am - 6 pm, M-F.  Also accepts Medicaid/Medicare and self-pay.  °[facility] ° [address], [place] Phone: (336) 832-3150, Fax: (336) 832-3151. Hours of  Operation:  8:30 am - 5:30 pm, M-F.  Also accepts Medicaid and self-pay.  °[facility] [place] [address], [place] Phone: (336) 878-6027   °[facility] [address], [place], [place] (336)723-1848, Ext. 123 Mondays & Thursdays: 7-9 AM.  First 15 patients are seen on a first come, first serve basis. °  ° °Medicaid-accepting [place] Providers: ° °Organization         Address  Phone   Notes  °[facility] [address], [place] (336) 641-2100 Also accepts self-pay patients.  °[facility] [address], [place] ° (336) 856-9996   °[facility] [address], [place] (336) 288-8857   °[facility] [address] [place] [address], [place] (336) 299-7000   °Veita Bland [address],   ° (336)   373-1557 Only accepts [place] Access Medicaid patients after they have their name applied to their card.  ° °Self-Pay (no insurance) in [place]: ° °Organization         Address  Phone   Notes  °Sickle Cell Patients, [facility] [address], [place] (336) 832-1970   °[facility] [address], [place] (336) 832-4400   °[facility] [place] [facility] ° [address], [place] (336) 992-4800   °[facility]/Dr. Osei-Bonsu ° [address] [place] [address], [place] or [address], [place] (336) 841-8500 Phone number for both [place] and [place] locations is the same.  °[facility] [address], [place] (336) 299-0000   °[facility] [address] [place] [address], [place] or [address] (336) 852-7530 °(336) 878-2260   °[facility] [address], [place] (336) 350-1642, phone; (336) 294-5005, fax Sees patients 1st and 3rd Saturday of every month.  Must not qualify for public or private insurance (i.e. Medicaid, Medicare,  [place] Health Choice, Veterans' Benefits) • Household income should be no more than 200% of the poverty level •The clinic cannot treat you if you are pregnant or think you are pregnant • Sexually transmitted diseases are not treated at the clinic.  ° ° °Dental Care: °Organization         Address  Phone  Notes  °[facility] [facility] [address], [place] (336) 641-6152 Accepts children up to age 21 who are enrolled in Medicaid or [place] Health Choice; pregnant women with a Medicaid card; and children who have applied for Medicaid or [place] Health Choice, but were declined, whose parents can pay a reduced fee at time of service.  °[facility] [place]  [address], [place] (336) 641-7733 Accepts children up to age 21 who are enrolled in Medicaid or [place] Health Choice; pregnant women with a Medicaid card; and children who have applied for Medicaid or [place] Health Choice, but were declined, whose parents can pay a reduced fee at time of service.  °[facility] ° [address], [place] (336) 641-4533 Patients are seen by appointment only. Walk-ins are not accepted. [facility] will see patients 18 years of age and older. °Monday - Tuesday (8am-5pm) °Most Wednesdays (8:30-5pm) °$30 per visit, cash only  °[facility] ° [address], [place] (336) 641-4533 Patients are seen by appointment only. Walk-ins are not accepted. [facility] will see patients 18 years of age and older. °One Wednesday Evening (Monthly: Volunteer Based).  $30 per visit, cash only  °[facility]  (919) 537-3737 for adults; Children under age 4, call Graduate Pediatric Dentistry at ([phone]) [phone]. Children aged 4-14, please call (919) [phone] to request a pediatric application. ° Dental services are provided in all areas of dental care including fillings, crowns and bridges,  complete and partial dentures, implants, gum treatment, root canals, and extractions. Preventive care is also provided. Treatment is provided to both adults and children. °Patients are selected via a lottery and there is often a waiting list. °  °[facility] [address], ° ° (336) 763-8833 [email or web address] °  °[facility] [address], [place],  (336)723-1848, Ext. 123 Second and Fourth Thursday of each month, opens at 6:30 AM;   Clinic ends at 9 AM.  Patients are seen on a first-come first-served basis, and a limited number are seen during each clinic.  ° °[facility] ° [address], [place], [place] (336) 723-7904   Eligibility Requirements °You must have lived in [place], [place], or [place] counties for at least the last three months. °  You cannot be eligible for state or federal sponsored healthcare insurance, including Veterans Administration, Medicaid, or Medicare. °  You generally cannot be eligible for healthcare insurance through your employer.  °  How to apply: °Eligibility screenings are held every Tuesday and Wednesday afternoon from 1:00 pm until 4:00 pm. You do not need an appointment for the interview!  °[facility] [address], [place], [place] 336-631-2330   °[facility]  336-342-8273   °[facility]  336-703-3100   °[facility]  336-570-6415   ° °Behavioral Health Resources in the Community: °Intensive Outpatient Programs °Organization         Address  Phone  Notes  °[facility] [address], [place], [place] 336-878-6098   °[facility] [address], [place], [place] 336-832-9800   °[facility] [address], [place], [place] ° 336-882-2125   °[facility] [address],  °[place], [place]  1-800-853-5163 or 336-641-4981   °Substance Abuse Resources °Organization          Address  Phone  Notes  °[facility]  336-882-2125   °[facility]  336-784-9470   °[facility]  336-285-9073   °[facility]  336-845-3988   °Residential & Outpatient Substance Abuse Program  1-800-659-3381   °Psychological Services °Organization         Address  Phone  Notes  °[facility]  336- 832-9600   °[facility]  336- 378-7881   °[facility] [address], [place] 1-800-853-5163 or 336-641-4981   ° °Mobile Crisis Teams °Organization         Address  Phone  Notes  °[facility], Mobile Crisis Care Unit  1-877-626-1772   °Assertive °[facility] ° [address] [place], [place] 336-834-9664   °Sharon DeEsch [address] °[place] 336-554-5454   ° °Self-Help/Support Groups °Organization         Address  Phone             Notes  °[facility] - variety of support groups  336- 373-1402 Call for more information  °Narcotics Anonymous (NA), [facility] [address], °[place]  2 meetings at this location  ° °Residential Treatment Programs °Organization         Address  Phone  Notes  °[facility] [address],    °[place]  1-866-801-8205   °[facility] ° [address], [place], [place] 704-293-8524   °[facility] [address], [place] 336-845-3988 Admissions: 8am-3pm M-F  °[facility] [address]    °[place], [place] 336-841-1104   °[facility] [address], [place], [place] 336-379-7146   °[facility] [address]  °[place], [place] 336-285-9073   °[facility] - Intensive Outpatient [address], , [place] 336-852-3033   °[facility] [address]  °[place],  1-877-615-2722 or 336-784-9470   °  [facility] [address] [place], [place]  336-227-7417 Accepts Medicaid  °[facility] [address]  °[place] 1-800-659-3381 Substance Abuse/Addiction Treatment  ° °[place] Behavioral Health Resources °Organization         Address  Phone  Notes  °[facility]  (888) 581-9988   °Julie Brannon, PhD [address] [place], [place]   (336) 349-5553 or (336) 951-0000   °[facility]   [address] °[place], [place] (336) 349-4454   °[facility] [address], [place], [place] (336) 342-8316 Insurance/Medicaid/sponsorship through [facility]  °[facility] [address]                                    [place], [place] (336) 342-8316 Therapy/tele-psych/case  °[facility] [address]  ° [place], [place] (336) 349-2233    °Dr. Arfeen  (336) 349-4544   °[facility] [place]  [employer] [place] [facility] 1) [address], [place] °2) [address], [place] °3)  [address], [place] (336) 349-3220 °(336) 342-7768 ° °(336) 342-8140   °[place] [facility] (336) 342-1394 or (336) 342-3537 (After Hours)    ° ° ° °

## 2014-08-27 NOTE — ED Notes (Signed)
Pt states he was having pain in his lower back but that has kind of resolved but now he is having pain in his right leg from his hip down past his knee  Pt states it feels like a charlie horse  Pt states the pain is worse with movement  Pt states his leg feels heavy

## 2014-09-03 ENCOUNTER — Encounter (HOSPITAL_COMMUNITY): Payer: Self-pay | Admitting: Emergency Medicine

## 2014-09-03 ENCOUNTER — Emergency Department (HOSPITAL_COMMUNITY)
Admission: EM | Admit: 2014-09-03 | Discharge: 2014-09-03 | Disposition: A | Discharge status: Home / Self Care | Payer: Self-pay | Attending: Emergency Medicine | Admitting: Emergency Medicine

## 2014-09-03 DIAGNOSIS — M5431 Sciatica, right side: Secondary | ICD-10-CM | POA: Insufficient documentation

## 2014-09-03 DIAGNOSIS — Z720 Tobacco use: Secondary | ICD-10-CM | POA: Insufficient documentation

## 2014-09-03 DIAGNOSIS — Z792 Long term (current) use of antibiotics: Secondary | ICD-10-CM | POA: Insufficient documentation

## 2014-09-03 DIAGNOSIS — F319 Bipolar disorder, unspecified: Secondary | ICD-10-CM | POA: Insufficient documentation

## 2014-09-03 DIAGNOSIS — Z79899 Other long term (current) drug therapy: Secondary | ICD-10-CM | POA: Insufficient documentation

## 2014-09-03 MED ORDER — HYDROCODONE-ACETAMINOPHEN 5-325 MG PO TABS
1.0000 | ORAL_TABLET | ORAL | Status: AC
  Administered 2014-09-03: 1 via ORAL
  Filled 2014-09-03: qty 1

## 2014-09-03 MED ORDER — PREDNISONE 20 MG PO TABS
40.0000 mg | ORAL_TABLET | Freq: Once | ORAL | Status: AC
  Administered 2014-09-03: 40 mg via ORAL
  Filled 2014-09-03: qty 2

## 2014-09-03 MED ORDER — PREDNISONE 20 MG PO TABS: 40.0000 mg | ORAL_TABLET | Freq: Every day | ORAL | Status: DC

## 2014-09-03 MED ORDER — TRAMADOL HCL 50 MG PO TABS: 50.0000 mg | ORAL_TABLET | Freq: Four times a day (QID) | ORAL | Status: DC | PRN

## 2014-09-03 NOTE — ED Provider Notes (Signed)
CSN: [id number]     Arrival date & time 09/03/14  1921 History  This chart was scribed for non-physician practitioner, Harle BattiestElizabeth Abigayl Hor, NP working with Gwyneth SproutWhitney Plunkett, MD by Gwenyth Oberatherine Macek, ED scribe. This patient was seen in room TR07C/TR07C and the patient's care was started at 7:53 PM  Chief Complaint  Patient presents with  . Leg Pain   The history is provided by the patient. No language interpreter was used.    HPI Comments: Benjamin DibbleScott X Kruck is a 38 y.o. male who presents to the Emergency Department complaining of back pain that started 2 months ago and gradually worsening, right posterior leg pain that radiates to his small right toe and started 3 weeks ago. He states pain is worse with pressure .Pt denies any injury associated with his pain.  He also denies history of diabetes and peptic ulcers. Pt denies bowel and bladder incontinence, saddle paresthesia, IV drug use, pain worse at night, and fevers as associated symptoms.   Past Medical History  Diagnosis Date  . Medical history non-contributory   . Depression   . Bipolar 1 disorder    Past Surgical History  Procedure Laterality Date  . Acne cyst removal      back   No family history on file. History  Substance Use Topics  . Smoking status: Heavy Tobacco Smoker -- 1.50 packs/day    Types: Cigarettes  . Smokeless tobacco: Never Used  . Alcohol Use: No    Review of Systems  Constitutional: Negative for fever.  Musculoskeletal: Positive for back pain and arthralgias.  Skin: Negative for wound.  Neurological: Negative for weakness and numbness.   Allergies  Review of patient's allergies indicates no known allergies.  Home Medications   Prior to Admission medications   Medication Sig Start Date End Date Taking? Authorizing Provider  cephALEXin (KEFLEX) 500 MG capsule Take 1 capsule (500 mg total) by mouth 3 (three) times daily. 05/16/14   Mellody DrownLauren Parker, PA-C  FLUoxetine (PROZAC) 40 MG capsule Take 1 capsule (40 mg  total) by mouth daily. 01/12/14   Fransisca KaufmannLaura Davis, NP  hydrOXYzine (ATARAX/VISTARIL) 25 MG tablet Take 1 tablet (25 mg total) by mouth every 6 (six) hours. 05/16/14   Mellody DrownLauren Parker, PA-C  QUEtiapine (SEROQUEL) 200 MG tablet Take 1 tablet (200 mg total) by mouth at bedtime. 01/12/14   Fransisca KaufmannLaura Davis, NP  traZODone (DESYREL) 50 MG tablet Take 1 tablet (50 mg total) by mouth at bedtime. 01/12/14   Fransisca KaufmannLaura Davis, NP   BP 108/72 mmHg  Pulse 72  Temp(Src) 97.6 F (36.4 C) (Oral)  Resp 16  Ht 6\' 2"  (1.88 m)  Wt 200 lb (90.719 kg)  BMI 25.67 kg/m2  SpO2 96% Physical Exam  Constitutional: He is oriented to person, place, and time. He appears well-developed and well-nourished. No distress.  HENT:  Head: Normocephalic and atraumatic.  Mouth/Throat: Oropharynx is clear and moist. No oropharyngeal exudate.  Eyes: Pupils are equal, round, and reactive to light.  Neck: Neck supple.  Cardiovascular: Normal rate.   Pulmonary/Chest: Effort normal.  Musculoskeletal: He exhibits no edema.  No midline or bony tenderness in cervical, thoracic or lumbar spine. Tender to palpation over right lateral buttock. 5/5 strength with plantar dorsiflexion. 5/5 straight leg raise of left leg. 4/5 straight leg raise of right leg.  Neurological: He is alert and oriented to person, place, and time. No cranial nerve deficit.  Skin: Skin is warm and dry. No rash noted.  Psychiatric: He has a normal mood  and affect. His behavior is normal.  Nursing note and vitals reviewed.   ED Course  Procedures (including critical care time)  DIAGNOSTIC STUDIES: Oxygen Saturation is 96% on RA, normal by my interpretation.    COORDINATION OF CARE: 7:55 PM Discussed treatment plan with pt at bedside and pt agreed to plan.  Labs Review Labs Reviewed - No data to display  Imaging Review No results found.   EKG Interpretation None      MDM   Final diagnoses:  Sciatica, right   38 yo male presenting to the ER with pain in rt buttock  that is radiating down leg. There has been no recent trauma and imaging is not indicated at this time.  Pt has good strength and no loss of sensation.  Discharge instructions include prescription prednisone and ultram and discussed  conservative home therapies (ie heat). Referral for PCP and orthopedics if symptoms persist.  Pt aware of plan and in agreement. Return precautions provided.    I personally performed the services described in this documentation, which was scribed in my presence. The recorded information has been reviewed and is accurate.  Filed Vitals:   09/03/14 1926  BP: 108/72  Pulse: 72  Temp: 97.6 F (36.4 C)  TempSrc: Oral  Resp: 16  Height: 6\' 2"  (1.88 m)  Weight: 200 lb (90.719 kg)  SpO2: 96%   Meds given in ED:  Medications  predniSONE (DELTASONE) tablet 40 mg (40 mg Oral Given 09/03/14 2002)  HYDROcodone-acetaminophen (NORCO/VICODIN) 5-325 MG per tablet 1 tablet (1 tablet Oral Given 09/03/14 2002)    Discharge Medication List as of 09/03/2014  8:02 PM    START taking these medications   Details  predniSONE (DELTASONE) 20 MG tablet Take 2 tablets (40 mg total) by mouth daily., Starting 09/03/2014, Until Discontinued, Print    traMADol (ULTRAM) 50 MG tablet Take 1 tablet (50 mg total) by mouth every 6 (six) hours as needed., Starting 09/03/2014, Until Discontinued, Print           Harle BattiestElizabeth Kaleb Linquist, NP 09/03/14 2051  Gwyneth SproutWhitney Plunkett, MD 09/04/14 1512

## 2014-09-03 NOTE — Discharge Instructions (Signed)
Please follow the directions provided. Be sure to use the [place] [facility] to establish care for a primary care provider. If your pain in your leg worsens or persists, you may use the orthopedic referral given for further management. Please use the prednisone daily for the next 5 days, and the ultram as needed for pain. Don't hesitate to return for any new, worsening, or concerning symptoms.  SEEK IMMEDIATE MEDICAL CARE IF:  You lose control of your bowel or bladder (incontinence).  You have increasing weakness in the lower back, pelvis, buttocks, or legs.  You have redness or swelling of your back.  You have a burning sensation when you urinate.  You have pain that gets worse when you lie down or awakens you at night.  Your pain is worse than you have experienced in the past.  Your pain is lasting longer than 4 weeks.  You are suddenly losing weight without reason.   Emergency Department Resource Guide 1) Find a Doctor and Pay Out of Pocket Although you won't have to find out who is covered by your insurance plan, it is a good idea to ask around and get recommendations. You will then need to call the office and see if the doctor you have chosen will accept you as a new patient and what types of options they offer for patients who are self-pay. Some doctors offer discounts or will set up payment plans for their patients who do not have insurance, but you will need to ask so you aren't surprised when you get to your appointment.  2) Contact Your Local Health Department Not all health departments have doctors that can see patients for sick visits, but many do, so it is worth a call to see if yours does. If you don't know where your local health department is, you can check in your phone book. The CDC also has a tool to help you locate your state's health department, and many state websites also have listings of all of their local health departments.  3) Find a Walk-in Clinic If your  illness is not likely to be very severe or complicated, you may want to try a walk in clinic. These are popping up all over the country in pharmacies, drugstores, and shopping centers. They're usually staffed by nurse practitioners or physician assistants that have been trained to treat common illnesses and complaints. They're usually fairly quick and inexpensive. However, if you have serious medical issues or chronic medical problems, these are probably not your best option.  No Primary Care Doctor: - Call Health Connect at  [phone] - they can help you locate a primary care doctor that  accepts your insurance, provides certain services, etc. - Physician Referral Service- [phone]  Chronic Pain Problems: Organization         Address  Phone   Notes  [facility]  [phone] Patients need to be referred by their primary care doctor.   Medication Assistance: Organization         Address  Phone   Notes  [facility] [address] [place], [place] [address] [phone] --Must be a resident of [place] [facility] -- Must have NO insurance coverage whatsoever (no Medicaid/ Medicare, etc.) -- The pt. MUST have a primary care doctor that directs their care regularly and follows them in the community   MedAssist  [phone]   [employer]  [phone]    Agencies  that provide inexpensive medical care: Organization         Address  Phone   Notes  [facility]  [phone]   [facility]    [phone]   [facility] [address] [place], [place] [address] [phone]   [facility] [place] [address] [place] [place] [phone]   Planned Parenthood    [phone]   [facility]    [phone]   [facility]  [address], [place] Phone:  [phone], Fax:  [phone] Hours of Operation:   9 am - 6 pm, M-F.  Also accepts Medicaid/Medicare and self-pay.  [facility]  [address], [place] Phone: [phone], Fax: [phone]. Hours of Operation:  8:30 am - 5:30 pm, M-F.  Also accepts Medicaid and self-pay.  [facility] [place] [address], [place] Phone: [phone]   [facility] [address] Natasha Bence [place], [place] [phone], Ext. [phone] Mondays & Thursdays: 7-9 AM.  First 15 patients are seen on a first come, first serve basis.    Medicaid-accepting [facility] Providers:  Organization         Address  Phone   Notes  [facility] [address], [place] [phone] Also accepts self-pay patients.  [facility] [address] Laurell Josephs [place], [place]  [phone]   [facility] [address], [place] [phone]   [facility] [address] [place] [phone]   Renaye Rakers [address], [place]   [phone] Only accepts [place] Access [place] patients after they have their name applied to their card.   Self-Pay (no insurance) in [facility]:  Organization         Address  Phone   Notes  Sickle Cell Patients, [facility] [address] [place], [place] [phone]   [facility] [address] [place], [place] [phone]   [facility]  [address], [place] [phone]   [facility]/Dr. Osei-Bonsu  [address], [place] or [address], [place] [phone] Phone number for both [place] and [place] locations is the same.  [facility]gent [facility] and [facility] [address] [place] [phone]   [facility] [address] [place] or [address] [phone]   [facility] [address] [place] [phone], phone; [phone], fax Sees patients 1st and 3rd Saturday of every month.  Must not qualify for public or private insurance (i.e. Medicaid, Medicare, [place] Health Choice, Veterans' Benefits)  Household income should be no more than 200% of the poverty level The clinic cannot treat you if you are pregnant or think you are pregnant  Sexually transmitted diseases are not treated at the clinic.    Dental Care: Organization         Address  Phone  Notes  [facility] [address] [place], [place] [phone] Accepts children up to age 6 who are enrolled in [place] or [place] Health Choice; pregnant women with a Medicaid card; and children who have applied for Medicaid or [place]  Health Choice, but were declined, whose parents can pay a reduced fee at time of service.  [facility] [place]  [address], [place] [phone] Accepts children up to age 38 who are enrolled in [place] or [place] Health Choice; pregnant women with a Medicaid card; and children who have applied for Medicaid or [place] Health Choice, but were declined, whose parents can pay a reduced fee at time of service.  [facility]  [address], [place] [phone] Patients are seen by appointment only. Walk-ins are not accepted. [facility] will see patients 38 years of age and older. Monday - Tuesday (8am-5pm) Most Wednesdays (8:30-5pm) $30 per visit, cash only  [facility]  [address], [facility] [phone] Patients are seen by appointment only. Walk-ins are not accepted. [facility] will see patients 38 years of age and older. One Wednesday Evening (Monthly: Volunteer Based).  $30 per visit, cash only  [facility] [facility]  [phone] for adults; Children under age 874, call Graduate Pediatric Dentistry at  [phone]. Children aged 34-14, please call [phone] to request a pediatric application.  Dental services are provided in all areas of dental care including fillings, crowns and bridges, complete and partial dentures, implants, gum treatment, root canals, and extractions. Preventive care is also provided. Treatment is provided to both adults and children. Patients are selected via a lottery and there is often a waiting list.   [facility] [address], [place]  [phone] [email or web address]   [facility] [address], [place], [place] [phone], Ext. [phone] Second and Fourth Thursday of each month, opens at 6:30 AM; Clinic ends at 9 AM.  Patients are seen on a first-come first-served basis, and a limited number are seen during each clinic.   [facility]  [address], [place], [place] [phone]   Eligibility Requirements You must have lived in [place], [place], or [place] counties for at least the last three months.   You cannot be eligible for state or federal sponsored National Cityhealthcare insurance, including CIGNAVeterans Administration, [place]icaid, or [employer]icare.   You generally cannot be eligible for healthcare insurance through your employer.    How to apply: Eligibility screenings are held every Tuesday and Wednesday afternoon from 1:00 pm until 4:00 pm. You do not need an appointment for the interview!  [facility] [address], [place], [place] [phone]   [facility]  [phone]   [facility]  [phone]   [facility]  [phone]    Behavioral Health Resources in the Community: Intensive Outpatient Programs Organization         Address  Phone  Notes  [facility] [address], [place], [place] [phone]   [facility] [address], [place], [place] [phone]   [facility]: Alcohol &  Drug Svcs [address], [place], [place]  [phone]   [facility] [address],  [place], [place] [phone] or [phone]   Substance Abuse Resources Organization         Address  Phone  Notes  [facility] Drug Services  [phone]   [facility]  [phone]   [facility]  [phone]   [facility]  [phone]   Residential & Outpatient Substance Abuse Program  [phone]  Psychological Services Organization         Address  Phone  Notes  [facility] Loiza  [place]  [phone]   [place] [address] [place] or [phone]    Mobile Crisis Teams Organization         Address  Phone  Notes  [facility], Mobile Crisis Care Unit  [phone]   [facility] Psychotherapeutic [facility]  [address] [place], [place]   [place] Levels [address] [place] [phone]    Self-Help/Support Groups Organization         Address  Phone             Notes  [place]. of [place] - variety of support groups  [place] Call for more information  Narcotics Anonymous (NA), [facility]aring [facility] [address], [employer] [place]  2 meetings at this location   Special educational needs teacher         Address  Phone  Notes  [facility]idential [facility] [place],    [place]  [phone]   [facility]  [address], [place] [phone], [place], [place]   [place], [place] [phone] Admissions: 8am-3pm M-F  Incentives Substance Ivor [address],    [place], [place] [phone]   [facility] [address] [place], [place], [place]   [facility] [address],  [place], [place]   [facility] Programs - Intensive Outpatient [address] Kristeen [address], [place], [place]   [facility] ([place] [place].,  [place], [place] [phone] or [phone]   [facility] (RTS) [address] [place], [place] Accepts Medicaid  [facility] [place] [address],  [place] [phone] Substance Abuse/Addiction Treatment   [facility] Organization         Address  Phone  Notes  [facility]  [phone]   Domenic Schwab, PhD [address] Arlis Porta [place], [place]   [phone] or [phone]   [place], [place] [phone]   [facility] [address], [place], [place] [phone] Insurance/Medicaid/sponsorship through [facility] and Families [address]                                    [place], [place] [phone] [place] [address], [place] [phone]    Dr. Adele Schilder  [phone]   [facility] Dept. 1) [address] [place] 2) [place] 3)  [place] [address], [place] [phone]  [phone]   [place] [phone] or [phone] (After Hours)

## 2014-09-03 NOTE — ED Notes (Signed)
Pt reports R leg pain x 3 weeks that has gotten worse. sts pain radiates from lower back down hip and down leg. sts it is difficult for him to bend over.

## 2014-09-05 NOTE — ED Provider Notes (Signed)
CSN: [id number]     Arrival date & time 08/26/14  2350 History   First MD Initiated Contact with Patient 08/27/14 [phone]     Chief Complaint  Patient presents with  . Leg Pain     (Consider location/radiation/quality/duration/timing/severity/associated sxs/prior Treatment) HPI   38 year old male with pain in his right lower extremity. Onset about 3 weeks ago. Initially pain in his lower back which has since resolved but he continued pain in his right leg. Denies any trauma. Pain is worse with certain movements, particularly bending forward at the waist. No numbness or tingling. No fevers or chills. No rash. No swelling.has been taking Tylenol with only mild relief.  Past Medical History  Diagnosis Date  . Medical history non-contributory   . Depression   . Bipolar 1 disorder    Past Surgical History  Procedure Laterality Date  . Acne cyst removal      back   History reviewed. No pertinent family history. History  Substance Use Topics  . Smoking status: Heavy Tobacco Smoker -- 1.50 packs/day    Types: Cigarettes  . Smokeless tobacco: Never Used  . Alcohol Use: No    Review of Systems  All systems reviewed and negative, other than as noted in HPI.   Allergies  Review of patient's allergies indicates no known allergies.  Home Medications   Prior to Admission medications   Medication Sig Start Date End Date Taking? Authorizing Provider  cephALEXin (KEFLEX) 500 MG capsule Take 1 capsule (500 mg total) by mouth 3 (three) times daily. 05/16/14   Mellody DrownLauren Parker, PA-C  FLUoxetine (PROZAC) 40 MG capsule Take 1 capsule (40 mg total) by mouth daily. 01/12/14   Fransisca KaufmannLaura Davis, NP  hydrOXYzine (ATARAX/VISTARIL) 25 MG tablet Take 1 tablet (25 mg total) by mouth every 6 (six) hours. 05/16/14   Mellody DrownLauren Parker, PA-C  predniSONE (DELTASONE) 20 MG tablet Take 2 tablets (40 mg total) by mouth daily. 09/03/14   Harle BattiestElizabeth Tysinger, NP  QUEtiapine (SEROQUEL) 200 MG tablet Take 1 tablet (200 mg total)  by mouth at bedtime. 01/12/14   Fransisca KaufmannLaura Davis, NP  traMADol (ULTRAM) 50 MG tablet Take 1 tablet (50 mg total) by mouth every 6 (six) hours as needed. 09/03/14   Harle BattiestElizabeth Tysinger, NP  traZODone (DESYREL) 50 MG tablet Take 1 tablet (50 mg total) by mouth at bedtime. 01/12/14   Fransisca KaufmannLaura Davis, NP   BP 124/77 mmHg  Pulse 65  Temp(Src) 98.4 F (36.9 C) (Oral)  Resp 20  Ht 6\' 1"  (1.854 m)  Wt 200 lb (90.719 kg)  BMI 26.39 kg/m2  SpO2 99% Physical Exam  Constitutional: He appears well-developed and well-nourished. No distress.  HENT:  Head: Normocephalic and atraumatic.  Eyes: Conjunctivae are normal. Right eye exhibits no discharge. Left eye exhibits no discharge.  Neck: Neck supple.  Cardiovascular: Normal rate, regular rhythm and normal heart sounds.  Exam reveals no gallop and no friction rub.   No murmur heard. Pulmonary/Chest: Effort normal and breath sounds normal. No respiratory distress.  Abdominal: Soft. He exhibits no distension. There is no tenderness.  Musculoskeletal: He exhibits no edema or tenderness.  Lower extremities symmetric as compared to each other. No concerning rash.Lower extremities symmetric as compared to each other. No calf tenderness. Negative Homan's. No palpable cords.Able to fully range his hips and knees throughout range of motion. Sensation is intact to light touch. Patellar reflexes are normal bilaterally. Palpable DP pulses.  Neurological: He is alert.  Skin: Skin is warm and dry.  Psychiatric: He has a normal mood and affect. His behavior is normal. Thought content normal.  Nursing note and vitals reviewed.   ED Course  Procedures (including critical care time) Labs Review Labs Reviewed - No data to display  Imaging Review No results found.   EKG Interpretation None      MDM   Final diagnoses:  Right leg pain    [age] year old male with pain in his right leg. Denies any acute trauma. Exam is pretty unremarkable. No evidence of infection.  Clinically not a DVT. He agreed keeping pulses. Potentially lumbar radiculopathy but not clearly. He is neurologically intact. Low suspicion for emergent process. Platelet symptomatically treatment at this time. Return precautions were discussed.    Raeford RazorStephen Kiam Bransfield, MD 09/05/14 [phone]

## 2014-09-27 ENCOUNTER — Encounter (HOSPITAL_COMMUNITY): Payer: Self-pay | Admitting: Emergency Medicine

## 2014-09-27 ENCOUNTER — Emergency Department (HOSPITAL_COMMUNITY)
Admission: EM | Admit: 2014-09-27 | Discharge: 2014-09-27 | Disposition: A | Discharge status: Home / Self Care | Payer: Self-pay | Attending: Emergency Medicine | Admitting: Emergency Medicine

## 2014-09-27 DIAGNOSIS — Z590 Homelessness: Secondary | ICD-10-CM | POA: Insufficient documentation

## 2014-09-27 DIAGNOSIS — R05 Cough: Secondary | ICD-10-CM | POA: Insufficient documentation

## 2014-09-27 DIAGNOSIS — Z791 Long term (current) use of non-steroidal anti-inflammatories (NSAID): Secondary | ICD-10-CM | POA: Insufficient documentation

## 2014-09-27 DIAGNOSIS — Z720 Tobacco use: Secondary | ICD-10-CM | POA: Insufficient documentation

## 2014-09-27 DIAGNOSIS — M5431 Sciatica, right side: Secondary | ICD-10-CM | POA: Insufficient documentation

## 2014-09-27 DIAGNOSIS — F329 Major depressive disorder, single episode, unspecified: Secondary | ICD-10-CM | POA: Insufficient documentation

## 2014-09-27 DIAGNOSIS — Z79899 Other long term (current) drug therapy: Secondary | ICD-10-CM | POA: Insufficient documentation

## 2014-09-27 MED ORDER — NAPROXEN 500 MG PO TABS: 500.0000 mg | ORAL_TABLET | Freq: Two times a day (BID) | ORAL | Status: DC

## 2014-09-27 MED ORDER — KETOROLAC TROMETHAMINE 60 MG/2ML IM SOLN
30.0000 mg | Freq: Once | INTRAMUSCULAR | Status: AC
  Administered 2014-09-27: 30 mg via INTRAMUSCULAR
  Filled 2014-09-27: qty 2

## 2014-09-27 MED ORDER — TRAMADOL HCL 50 MG PO TABS: 50.0000 mg | ORAL_TABLET | Freq: Four times a day (QID) | ORAL | Status: DC | PRN

## 2014-09-27 NOTE — Discharge Instructions (Signed)
Sciatica °Sciatica is pain, weakness, numbness, or tingling along the path of the sciatic nerve. The nerve starts in the lower back and runs down the back of each leg. The nerve controls the muscles in the lower leg and in the back of the knee, while also providing sensation to the back of the thigh, lower leg, and the sole of your foot. Sciatica is a symptom of another medical condition. For instance, nerve damage or certain conditions, such as a herniated disk or bone spur on the spine, pinch or put pressure on the sciatic nerve. This causes the pain, weakness, or other sensations normally associated with sciatica. Generally, sciatica only affects one side of the body. °CAUSES  °· Herniated or slipped disc. °· Degenerative disk disease. °· A pain disorder involving the narrow muscle in the buttocks (piriformis syndrome). °· Pelvic injury or fracture. °· Pregnancy. °· Tumor (rare). °SYMPTOMS  °Symptoms can vary from mild to very severe. The symptoms usually travel from the low back to the buttocks and down the back of the leg. Symptoms can include: °· Mild tingling or dull aches in the lower back, leg, or hip. °· Numbness in the back of the calf or sole of the foot. °· Burning sensations in the lower back, leg, or hip. °· Sharp pains in the lower back, leg, or hip. °· Leg weakness. °· Severe back pain inhibiting movement. °These symptoms may get worse with coughing, sneezing, laughing, or prolonged sitting or standing. Also, being overweight may worsen symptoms. °DIAGNOSIS  °Your caregiver will perform a physical exam to look for common symptoms of sciatica. He or she may ask you to do certain movements or activities that would trigger sciatic nerve pain. Other tests may be performed to find the cause of the sciatica. These may include: °· Blood tests. °· X-rays. °· Imaging tests, such as an MRI or CT scan. °TREATMENT  °Treatment is directed at the cause of the sciatic pain. Sometimes, treatment is not necessary  and the pain and discomfort goes away on its own. If treatment is needed, your caregiver may suggest: °· Over-the-counter medicines to relieve pain. °· Prescription medicines, such as anti-inflammatory medicine, muscle relaxants, or narcotics. °· Applying heat or ice to the painful area. °· Steroid injections to lessen pain, irritation, and inflammation around the nerve. °· Reducing activity during periods of pain. °· Exercising and stretching to strengthen your abdomen and improve flexibility of your spine. Your caregiver may suggest losing weight if the extra weight makes the back pain worse. °· Physical therapy. °· Surgery to eliminate what is pressing or pinching the nerve, such as a bone spur or part of a herniated disk. °HOME CARE INSTRUCTIONS  °· Only take over-the-counter or prescription medicines for pain or discomfort as directed by your caregiver. °· Apply ice to the affected area for 20 minutes, 3-4 times a day for the first 48-72 hours. Then try heat in the same way. °· Exercise, stretch, or perform your usual activities if these do not aggravate your pain. °· Attend physical therapy sessions as directed by your caregiver. °· Keep all follow-up appointments as directed by your caregiver. °· Do not wear high heels or shoes that do not provide proper support. °· Check your mattress to see if it is too soft. A firm mattress may lessen your pain and discomfort. °SEEK IMMEDIATE MEDICAL CARE IF:  °· You lose control of your bowel or bladder (incontinence). °· You have increasing weakness in the lower back, pelvis, buttocks,   or legs. °· You have redness or swelling of your back. °· You have a burning sensation when you urinate. °· You have pain that gets worse when you lie down or awakens you at night. °· Your pain is worse than you have experienced in the past. °· Your pain is lasting longer than 4 weeks. °· You are suddenly losing weight without reason. °MAKE SURE YOU: °· Understand these  instructions. °· Will watch your condition. °· Will get help right away if you are not doing well or get worse. °Document Released: 10/06/2001 Document Revised: 04/12/2012 Document Reviewed: 02/21/2012 °ExitCare® Patient Information ©2015 [employer]. This information is not intended to replace advice given to you by your health care provider. Make sure you discuss any questions you have with your health care provider. ° °Emergency Department Resource Guide °1) Find a Doctor and Pay Out of Pocket °Although you won't have to find out who is covered by your insurance plan, it is a good idea to ask around and get recommendations. You will then need to call the office and see if the doctor you have chosen will accept you as a new patient and what types of options they offer for patients who are self-pay. Some doctors offer discounts or will set up payment plans for their patients who do not have insurance, but you will need to ask so you aren't surprised when you get to your appointment. ° °2) Contact Your Local Health Department °Not all health departments have doctors that can see patients for sick visits, but many do, so it is worth a call to see if yours does. If you don't know where your local health department is, you can check in your phone book. The CDC also has a tool to help you locate your state's health department, and many state websites also have listings of all of their local health departments. ° °3) Find a Walk-in Clinic °If your illness is not likely to be very severe or complicated, you may want to try a walk in clinic. These are popping up all over the country in pharmacies, drugstores, and shopping centers. They're usually staffed by nurse practitioners or physician assistants that have been trained to treat common illnesses and complaints. They're usually fairly quick and inexpensive. However, if you have serious medical issues or chronic medical problems, these are probably not your best  option. ° °No Primary Care Doctor: °- Call [facility] at  832-8000 - they can help you locate a primary care doctor that  accepts your insurance, provides certain services, etc. °- Physician Referral Service- 1-800-533-3463 ° °Chronic Pain Problems: °Organization         Address  Phone   Notes  °[facility]  (336) 297-2271 Patients need to be referred by their primary care doctor.  ° °Medication Assistance: °Organization         Address  Phone   Notes  °[place] [facility] [address] °[place], [place] [address] (336) 641-8030 --Must be a resident of [place] °-- Must have NO insurance coverage whatsoever (no Medicaid/ Medicare, etc.) °-- The pt. MUST have a primary care doctor that directs their care regularly and follows them in the community °  °[facility]  (866) 331-1348   °[employer]  (888) 892-1162   ° °Agencies that provide inexpensive medical care: °Organization         Address  Phone   Notes  °[facility]  (  336) 832-8035   °[facility]    (336) 832-7272   °[facility] [address] °[place], [place] [address] (336) 832-4777   °[facility] [address], °[place] (336) 271-4999   °[facility]    (336) 373-0678   °[facility]    (336) 272-1050   °[facility] ° [address], [place] Phone:  (336) 832-4444, Fax:  (336) 832-4440 Hours of Operation:  9 am - 6 pm, M-F.  Also accepts Medicaid/Medicare and self-pay.  °[facility] ° [address], [place] Phone: (336) 832-3150, Fax: (336) 832-3151. Hours of Operation:  8:30 am - 5:30 pm, M-F.  Also accepts Medicaid and self-pay.  °[facility] [place] [address], [place] Phone: (336) 878-6027   °[facility] [address], [place], [place] (336)723-1848, Ext. 123 Mondays & Thursdays: 7-9 AM.  First 15  patients are seen on a first come, first serve basis. °  ° °Medicaid-accepting [place] Providers: ° °Organization         Address  Phone   Notes  °[facility] [address], [place] (336) 641-2100 Also accepts self-pay patients.  °[facility] [address], [place] ° (336) 856-9996   °[facility] [address], [place] (336) 288-8857   °[facility] [address] [place] [address], [place] (336) 299-7000   °Veita Bland [address], [place]  ° (336) 373-1557 Only accepts [place] Access Medicaid patients after they have their name applied to their card.  ° °Self-Pay (no insurance) in [place]: ° °Organization         Address  Phone   Notes  °Sickle Cell Patients, [facility] [address], [place] (336) 832-1970   °[facility] [address], [place] (336) 832-4400   °[facility] ° [address], [place] (336) 992-4800   °[facility]/Dr. Osei-Bonsu ° [address] [place] [address], [place] or [address], [place] (336) 841-8500 Phone number for both [place] and [place] locations is the same.  °[facility] [address], [place] (336) 299-0000   °[facility] [address] [place] [address], [place] or [address] (336) 852-7530 °(336) 878-2260   °[facility] [address],  (336) 350-1642, phone; (336) 294-5005, fax Sees patients 1st and 3rd Saturday of every month.  Must not qualify for public or private insurance (i.e. Medicaid, Medicare, [place] Health Choice, Veterans' Benefits) • Household income should be no more than 200% of the poverty level •The clinic cannot treat you if you are pregnant or think you are pregnant • Sexually transmitted diseases are not treated at the clinic.  ° ° °Dental  Care: °Organization         Address  Phone  Notes  °[facility] [address],  (336) 641-6152 Accepts children up to age 21 who are enrolled in Medicaid or [place] Health Choice; pregnant women with a Medicaid card; and children who have applied for Medicaid or  Health Choice, but were declined, whose parents can pay a reduced fee at time of service.  °[facility] [place]    [address], [place] (336) 641-7733 Accepts children up to age 21 who are enrolled in Medicaid or [place] Health Choice; pregnant women with a Medicaid card; and children who have applied for Medicaid or [place] Health Choice, but were declined, whose parents can pay a reduced fee at time of service.  °[facility] ° [address], [place] (336) 641-4533 Patients are seen by appointment only. Walk-ins are not accepted. [facility] will see patients 18 years of age and older. °Monday - Tuesday (8am-5pm) °Most Wednesdays (8:30-5pm) °$30 per visit, cash only  °[facility] ° [address], [place] (336) 641-4533 Patients are seen by appointment only. Walk-ins are not accepted. [facility] will see patients 18 years of age and older. °One Wednesday Evening (Monthly: Volunteer Based).  $30 per visit, cash only  °[facility]  (919) [phone] for adults; Children under age 4, call Graduate Pediatric Dentistry at ([phone]) [phone]. Children aged 4-14, please call (919) [phone] to request a pediatric application. ° Dental services are provided in all areas of dental care including fillings, crowns and bridges, complete and partial dentures, implants, gum treatment, root canals, and extractions. Preventive care is also provided. Treatment is provided to both adults and children. °Patients are selected via a lottery and there is often a waiting list. °  °[facility] [address], °[place] ° (336) 763-8833 [email or web address] °  °[facility] [address], [place], [place] (336)723-1848, Ext. 123 Second and Fourth Thursday of each month, opens at 6:30 AM; Clinic ends at 9 AM.  Patients are seen on a first-come first-served basis, and a limited number are seen during each clinic.  ° °[facility] ° [address], [place], [place] (336) 723-7904   Eligibility Requirements °You must have lived in [place], [place], or [place] counties for at least the last three months. °  You cannot be eligible for state or federal sponsored healthcare insurance, including Veterans Administration, Medicaid, or Medicare. °  You generally cannot be eligible for healthcare insurance through your employer.  °  How to apply: °Eligibility screenings are held every Tuesday and Wednesday afternoon from 1:00 pm until 4:00 pm. You do not need an appointment for the interview!  °[facility] [address], [place], [place] 336-631-2330   °[facility]  336-342-8273   °[facility]  336-703-3100   °[facility]  336-570-6415   ° °Behavioral Health Resources in the Community: °Intensive Outpatient Programs °Organization         Address  Phone  Notes  °[facility] [address], [place], [place] 336-878-6098   °[facility] [address], [place], [place] 336-832-9800   °[facility] [address], [place], [place] ° 336-882-2125   °[facility] [address],  °[place], [place] 1-800-853-5163 or 336-641-4981   °Substance Abuse Resources °Organization         Address  Phone  Notes  °[facility]  336-882-2125   °[facility]  336-784-9470   °[facility]  336-285-9073   °[facility]  336-845-3988   °Residential & Outpatient Substance Abuse Program  1-800-659-3381    °Psychological Services °Organization         Address  Phone  Notes  °[facility]  336- 832-9600   °  [facility]  336- 378-7881   °[facility] [address], [place] 1-800-853-5163 or 336-641-4981   ° °Mobile Crisis Teams °Organization         Address  Phone  Notes  °[facility], Mobile Crisis Care Unit  1-877-626-1772   °Assertive °[facility] ° [address] [place], [place] 336-834-9664   °Sharon DeEsch [address] °[place] 336-554-5454   ° °Self-Help/Support Groups °Organization         Address  Phone             Notes  °[facility] [place] - variety of support groups  336- 373-1402 Call for more information  °Narcotics Anonymous (NA), [facility] [address], °[place]  2 meetings at this location  ° °Residential Treatment Programs °Organization         Address  Phone  Notes  °[facility] [address],    °[place]  1-866-801-8205   °[facility] ° [address], [place], [place] 704-293-8524   °[facility] [address], [place] 336-845-3988 Admissions: 8am-3pm M-F  °[facility] [address]    °[place], [place] 336-841-1104   °[facility] [address], [place], [place] 336-379-7146   °[facility] [address]  °[place], [place] 336-285-9073   °[facility] Intensive Outpatient [address], [place], [place] 336-852-3033   °[facility] [address]  °[place], [place] 1-877-615-2722 or 336-784-9470   °[facility] [address] [place], [place] 336-227-7417 Accepts Medicaid  °[facility] [address]  ° [place] 1-800-659-3381 Substance Abuse/Addiction Treatment  ° °[place] Behavioral Health Resources °Organization         Address  Phone  Notes  °[facility]  (888) 581-9988   °Julie Brannon, PhD [address] [place], [place]   (336) 349-5553 or (336) 951-0000   °[facility]   [address] °[place], [place] (336) 349-4454   °[facility] [address], [place], [place] (336) 342-8316 Insurance/Medicaid/sponsorship through [facility]  °[facility] [address]                                    [place], [place] (336) 342-8316 Therapy/tele-psych/case  °[facility] [address]  ° [place], [place] (336) 349-2233    °Dr. Arfeen  (336) 349-4544   °[facility] [place]  [employer] [place] [facility] 1) [address],  °2) [address], [place] °3)  [address], [place] (336) 349-3220 °(336) 342-7768 ° °(336) 342-8140   °[place] [facility] (336) 342-1394 or (336) 342-3537 (After Hours)    ° ° °

## 2014-09-27 NOTE — ED Notes (Signed)
Pt tolerated crackers and sprite without difficulty.

## 2014-09-27 NOTE — ED Notes (Signed)
Pt c/o right leg pain into foot with numbness x 2 weeks; pain goes into lower back

## 2014-09-27 NOTE — ED Notes (Signed)
Pt complaining of leg pain when he coughs. Pt states he is getting a cold. No fevers according to pt.

## 2014-09-27 NOTE — ED Provider Notes (Signed)
CSN: [id number]     Arrival date & time 09/27/14  1452 History   First MD Initiated Contact with Patient 09/27/14 1749     Chief Complaint  Patient presents with  . Leg Pain    Benjamin Butler is a 38 y.o. homeless male with history of bipolar disorder, depression and sciatica who presents to the ED complaining of atraumatic right leg pain that radiates into his right foot for the past 2 weeks that has been worse the past 6 days. The patient's pain starts at his hip radiates behind his buttocks and behind his posterior leg and into the lateral aspect of his right foot. Patient reports it feels like his ankle and lateral foot are numb.Patient rates his pain at an 8 out of 10 sharp. The patient's pain is worse with driving and sitting. Patient feels better lying down. Patient took Tylenol at 2612 PM today with some relief. Patient was seen for sciatica on 09/03/2014 and given prednisone and tramadol. The patient reports he did not have enough money for all the medicines and so he took half of those prescribed of both the prednisone and tramadol. Patient reports he had some relief with this medicine, but is currently out. Patient reports his pain is the same as when he was diagnosed with sciatica on 09/03/2014. Patient reports he's had a cough for 2-3 days and is a smoker. Patient denies back pain. The patient denies recent trauma.  The patient denies loss of bowel or bladder control. The patient denies saddle anesthesia. Patient denies history of cancer or IV drug use. Patient denies fevers, chills, abdominal pain, nausea, vomiting, dysuria, leg swelling or difficulty walking. Patient was able to walk into the emergency department today.  (Consider location/radiation/quality/duration/timing/severity/associated sxs/prior Treatment) HPI  Past Medical History  Diagnosis Date  . Medical history non-contributory   . Depression   . Bipolar 1 disorder    Past Surgical History  Procedure Laterality Date  .  Acne cyst removal      back   History reviewed. No pertinent family history. History  Substance Use Topics  . Smoking status: Heavy Tobacco Smoker -- 1.50 packs/day    Types: Cigarettes  . Smokeless tobacco: Never Used  . Alcohol Use: No    Review of Systems  Constitutional: Negative for fever and chills.  HENT: Negative for congestion, sore throat and trouble swallowing.   Eyes: Negative for visual disturbance.  Respiratory: Positive for cough. Negative for shortness of breath and wheezing.   Cardiovascular: Negative for chest pain, palpitations and leg swelling.  Gastrointestinal: Negative for nausea, vomiting, abdominal pain and diarrhea.  Genitourinary: Negative for dysuria and difficulty urinating.  Musculoskeletal: Negative for back pain and neck pain.       Right leg pain  Skin: Negative for rash and wound.  Neurological: Positive for numbness. Negative for dizziness, syncope, light-headedness and headaches.  All other systems reviewed and are negative.     Allergies  Review of patient's allergies indicates no known allergies.  Home Medications   Prior to Admission medications   Medication Sig Start Date End Date Taking? Authorizing Provider  acetaminophen (TYLENOL) 325 MG tablet Take 975 mg by mouth every 6 (six) hours as needed for mild pain.   Yes Historical Provider, MD  cephALEXin (KEFLEX) 500 MG capsule Take 1 capsule (500 mg total) by mouth 3 (three) times daily. Patient not taking: Reported on 09/27/2014 05/16/14   Mellody DrownLauren Parker, PA-C  FLUoxetine (PROZAC) 40 MG capsule Take 1  capsule (40 mg total) by mouth daily. Patient not taking: Reported on 09/27/2014 01/12/14   Fransisca KaufmannLaura Davis, NP  hydrOXYzine (ATARAX/VISTARIL) 25 MG tablet Take 1 tablet (25 mg total) by mouth every 6 (six) hours. Patient not taking: Reported on 09/27/2014 05/16/14   Mellody DrownLauren Parker, PA-C  naproxen (NAPROSYN) 500 MG tablet Take 1 tablet (500 mg total) by mouth 2 (two) times daily with a meal.  09/27/14   Einar GipWilliam Duncan Shane Badeaux, PA-C  predniSONE (DELTASONE) 20 MG tablet Take 2 tablets (40 mg total) by mouth daily. Patient not taking: Reported on 09/27/2014 09/03/14   Harle BattiestElizabeth Tysinger, NP  QUEtiapine (SEROQUEL) 200 MG tablet Take 1 tablet (200 mg total) by mouth at bedtime. Patient not taking: Reported on 09/27/2014 01/12/14   Fransisca KaufmannLaura Davis, NP  traMADol (ULTRAM) 50 MG tablet Take 1 tablet (50 mg total) by mouth every 6 (six) hours as needed. 09/27/14   Einar GipWilliam Duncan Julianny Milstein, PA-C  traZODone (DESYREL) 50 MG tablet Take 1 tablet (50 mg total) by mouth at bedtime. Patient not taking: Reported on 09/27/2014 01/12/14   Fransisca KaufmannLaura Davis, NP   BP 112/70 mmHg  Pulse 70  Temp(Src) 98.5 F (36.9 C)  Resp 14  SpO2 100% Physical Exam  Constitutional: He appears well-developed and well-nourished. No distress.  HENT:  Head: Normocephalic and atraumatic.  Mouth/Throat: Oropharynx is clear and moist. No oropharyngeal exudate.  Eyes: Conjunctivae are normal. Pupils are equal, round, and reactive to light. Right eye exhibits no discharge. Left eye exhibits no discharge.  Neck: Neck supple.  Cardiovascular: Normal rate, regular rhythm, normal heart sounds and intact distal pulses.  Exam reveals no gallop and no friction rub.   No murmur heard. Bilateral posterior tibialis pulses are intact.  Pulmonary/Chest: Effort normal and breath sounds normal. No respiratory distress. He has no wheezes. He has no rales.  Abdominal: Soft. There is no tenderness.  Musculoskeletal: He exhibits no edema.  Patient is able to ambulate in the room using his cane. Patient's strength is 5 out of 5 in his bilateral upper and lower extremities. Negative straight leg raise. Patient's bilateral patellar DTRs are intact. Patient has full range of motion of his bilateral lower extremities. Patient's calves are equal in size. No lower extremity edema noted. Bilateral posterior tibialis pulses are intact.  Lymphadenopathy:    He has no  cervical adenopathy.  Neurological: He is alert. He has normal reflexes. He displays normal reflexes. Coordination normal.  Patient's bilateral patellar DTRs are intact. The patient is a negative straight leg raise. The patient's sensation is intact in his bilateral lower extremities.  Skin: Skin is warm and dry. No rash noted. He is not diaphoretic. No erythema. No pallor.  Psychiatric: He has a normal mood and affect. His behavior is normal.  Nursing note and vitals reviewed.   ED Course  Procedures (including critical care time) Labs Review Labs Reviewed - No data to display  Imaging Review No results found.   EKG Interpretation None      Filed Vitals:   09/27/14 1900 09/27/14 1915 09/27/14 1930 09/27/14 1945  BP: 101/56 102/49 117/78 112/70  Pulse: 64 65 70 70  Temp:      Resp: 14 16 16 14   SpO2: 96% 95% 100% 100%     MDM   Meds given in ED:  Medications  ketorolac (TORADOL) injection 30 mg (30 mg Intramuscular Given 09/27/14 1831)    Discharge Medication List as of 09/27/2014  7:37 PM    START taking these  medications   Details  naproxen (NAPROSYN) 500 MG tablet Take 1 tablet (500 mg total) by mouth 2 (two) times daily with a meal., Starting 09/27/2014, Until Discontinued, Print        Final diagnoses:  Sciatica, right   KEN BONN is a 38 y.o. homeless male with history of bipolar disorder, depression and sciatica who presents to the ED complaining of atraumatic right leg pain that radiates into his right foot for the past 2 weeks that has been worse the past 6 days. Patient is seen here for the same on 09/03/2014 diagnosis sciatica. Patient reports his pain is unchanged from previous. The patient was unable to complete a course of steroids from his last visit due to being unable to pay for them.  Patient is afebrile and nontoxic-appearing. Patient has good strength in his bilateral upper and lower external knees. The patient has good sensations bilateral lower  extreme is. There is no lower extremity edema. Patient's bilateral posterior tibialis pulses are intact. Patient's bilateral patellar DTRs are intact. Patient is able to ambulate without assistance. The patient reports improvement with Toradol given in the ED. We'll discharge this patient with naproxen and tramadol for pain. We'll avoid giving steroids due to the patient's history of bipolar disorder. I advised patient to follow-up with his primary care physician for continued pain. I advised patient return to the ED with worsening symptoms or new concerns. Patient verbalized understanding and agreement with plan.   This patient was discussed with and evaluated by Dr. Rosalia Hammers who agrees with assessment and plan.     Lawana Chambers, PA-C 09/28/14 0127  Hilario Quarry, MD 09/29/14 [phone]

## 2014-09-27 NOTE — ED Notes (Signed)
Pt given crackers and a sprite per orders.

## 2014-10-04 ENCOUNTER — Encounter (HOSPITAL_COMMUNITY): Payer: Self-pay | Admitting: Cardiology

## 2014-10-04 ENCOUNTER — Emergency Department (HOSPITAL_COMMUNITY): Payer: Self-pay

## 2014-10-04 ENCOUNTER — Emergency Department (HOSPITAL_COMMUNITY)
Admission: EM | Admit: 2014-10-04 | Discharge: 2014-10-04 | Disposition: A | Discharge status: Home / Self Care | Payer: Self-pay | Attending: Emergency Medicine | Admitting: Emergency Medicine

## 2014-10-04 DIAGNOSIS — Z791 Long term (current) use of non-steroidal anti-inflammatories (NSAID): Secondary | ICD-10-CM | POA: Insufficient documentation

## 2014-10-04 DIAGNOSIS — M79604 Pain in right leg: Secondary | ICD-10-CM | POA: Insufficient documentation

## 2014-10-04 DIAGNOSIS — Z720 Tobacco use: Secondary | ICD-10-CM | POA: Insufficient documentation

## 2014-10-04 DIAGNOSIS — R531 Weakness: Secondary | ICD-10-CM | POA: Insufficient documentation

## 2014-10-04 DIAGNOSIS — R200 Anesthesia of skin: Secondary | ICD-10-CM | POA: Insufficient documentation

## 2014-10-04 DIAGNOSIS — F329 Major depressive disorder, single episode, unspecified: Secondary | ICD-10-CM | POA: Insufficient documentation

## 2014-10-04 DIAGNOSIS — M545 Low back pain: Secondary | ICD-10-CM | POA: Insufficient documentation

## 2014-10-04 DIAGNOSIS — Z79899 Other long term (current) drug therapy: Secondary | ICD-10-CM | POA: Insufficient documentation

## 2014-10-04 LAB — BASIC METABOLIC PANEL
Anion gap: 15 (ref 5–15)
BUN: 13 mg/dL (ref 6–23)
CALCIUM: 10 mg/dL (ref 8.4–10.5)
CO2: 25 mEq/L (ref 19–32)
Chloride: 98 mEq/L (ref 96–112)
Creatinine, Ser: 0.85 mg/dL (ref 0.50–1.35)
GFR calc Af Amer: >90 mL/min (ref >90)
Glucose, Bld: 85 mg/dL (ref 70–99)
Potassium: 4.5 mEq/L (ref 3.7–5.3)
Sodium: 138 mEq/L (ref 137–147)

## 2014-10-04 LAB — CBC WITH DIFFERENTIAL/PLATELET
Basophils Absolute: 0 10*3/uL (ref 0.0–0.1)
Basophils Relative: 0 % (ref 0–1)
EOS ABS: 0.1 10*3/uL (ref 0.0–0.7)
EOS PCT: 1 % (ref 0–5)
HEMATOCRIT: 42.6 % (ref 39.0–52.0)
HEMOGLOBIN: 14.7 g/dL (ref 13.0–17.0)
LYMPHS ABS: 2.2 10*3/uL (ref 0.7–4.0)
Lymphocytes Relative: 24 % (ref 12–46)
MCH: 30.4 pg (ref 26.0–34.0)
MCHC: 34.5 g/dL (ref 30.0–36.0)
MCV: 88 fL (ref 78.0–100.0)
MONO ABS: 0.9 10*3/uL (ref 0.1–1.0)
Monocytes Relative: 10 % (ref 3–12)
Neutro Abs: 6 10*3/uL (ref 1.7–7.7)
Neutrophils Relative %: 65 % (ref 43–77)
Platelets: 247 10*3/uL (ref 150–400)
RBC: 4.84 MIL/uL (ref 4.22–5.81)
RDW: 12.6 % (ref 11.5–15.5)
WBC: 9.2 10*3/uL (ref 4.0–10.5)

## 2014-10-04 LAB — URINALYSIS, ROUTINE W REFLEX MICROSCOPIC
GLUCOSE, UA: NEGATIVE mg/dL
Hgb urine dipstick: NEGATIVE
KETONES UR: 15 mg/dL — AB
Leukocytes, UA: NEGATIVE
Nitrite: NEGATIVE
PH: 6.5 (ref 5.0–8.0)
PROTEIN: NEGATIVE mg/dL
Specific Gravity, Urine: 1.031 — ABNORMAL HIGH (ref 1.005–1.030)
Urobilinogen, UA: 1 mg/dL (ref 0.0–1.0)

## 2014-10-04 MED ORDER — KETOROLAC TROMETHAMINE 60 MG/2ML IM SOLN
60.0000 mg | Freq: Once | INTRAMUSCULAR | Status: AC
  Administered 2014-10-04: 60 mg via INTRAMUSCULAR
  Filled 2014-10-04: qty 2

## 2014-10-04 MED ORDER — ONDANSETRON 4 MG PO TBDP
4.0000 mg | ORAL_TABLET | Freq: Once | ORAL | Status: AC
  Administered 2014-10-04: 4 mg via ORAL
  Filled 2014-10-04: qty 1

## 2014-10-04 MED ORDER — OXYCODONE-ACETAMINOPHEN 5-325 MG PO TABS
2.0000 | ORAL_TABLET | Freq: Once | ORAL | Status: AC
  Administered 2014-10-04: 2 via ORAL
  Filled 2014-10-04: qty 2

## 2014-10-04 NOTE — ED Provider Notes (Signed)
Patient signed over to me by Roxy Horsemanobert Browning, PA-C. Plan is pending MRI results patient may be discharged home for symptomatic care. Will need to follow-up with his primary care. MRI shows no emergent pathology at this time. Patient may be discharged home Filed Vitals:   10/04/14 1449  BP: 128/63  Pulse: 95  Temp: 98.1 F (36.7 C)  Resp: [name]     Benjamin Butler W Fountain Innartner, [place] JerseyPA-C 10/05/14 0140  Benjamin SheffieldForrest Harrison, MD 10/05/14 1948

## 2014-10-04 NOTE — ED Provider Notes (Signed)
CSN: [id number]     Arrival date & time 10/04/14  1425 History   First MD Initiated Contact with Patient 10/04/14 1530     Chief Complaint  Patient presents with  . Numbness  . Hip Pain     (Consider location/radiation/quality/duration/timing/severity/associated sxs/prior Treatment) HPI Comments: Patient presents to the emergency department with chief complaint of right leg pain and weakness. Patient states that he has been having this problem for the past several months. He states that his symptoms are progressively worsening. He has been seen multiple times for the same, and treated for sciatica with prednisone and pain medication. States that he only gets moderate relief. He now states that the numbness is progressing to the point to where he cannot feel his leg and it is causing him to fall when he takes steps. He has fallen repeatedly. He denies any bowel or bladder incontinence or saddle anesthesia. Denies any fevers or chills.  The history is provided by the patient. No language interpreter was used.    Past Medical History  Diagnosis Date  . Medical history non-contributory   . Depression   . Bipolar 1 disorder    Past Surgical History  Procedure Laterality Date  . Acne cyst removal      back   History reviewed. No pertinent family history. History  Substance Use Topics  . Smoking status: Heavy Tobacco Smoker -- 1.50 packs/day    Types: Cigarettes  . Smokeless tobacco: Never Used  . Alcohol Use: No    Review of Systems  Constitutional: Negative for fever and chills.  Gastrointestinal:       No bowel incontinence  Genitourinary:       No urinary incontinence  Musculoskeletal: Positive for myalgias, back pain and arthralgias.  Neurological: Positive for weakness.       No saddle anesthesia      Allergies  Review of patient's allergies indicates no known allergies.  Home Medications   Prior to Admission medications   Medication Sig Start Date End Date  Taking? Authorizing Provider  acetaminophen (TYLENOL) 325 MG tablet Take 975 mg by mouth every 6 (six) hours as needed for mild pain.   Yes Historical Provider, MD  naproxen (NAPROSYN) 500 MG tablet Take 1 tablet (500 mg total) by mouth 2 (two) times daily with a meal. 09/27/14  Yes Einar GipWilliam Duncan Dansie, PA-C  traZODone (DESYREL) 50 MG tablet Take 1 tablet (50 mg total) by mouth at bedtime. 01/12/14  Yes Fransisca KaufmannLaura Davis, NP  cephALEXin (KEFLEX) 500 MG capsule Take 1 capsule (500 mg total) by mouth 3 (three) times daily. Patient not taking: Reported on 10/04/2014 05/16/14   Mellody DrownLauren Parker, PA-C  FLUoxetine (PROZAC) 40 MG capsule Take 1 capsule (40 mg total) by mouth daily. Patient not taking: Reported on 10/04/2014 01/12/14   Fransisca KaufmannLaura Davis, NP  hydrOXYzine (ATARAX/VISTARIL) 25 MG tablet Take 1 tablet (25 mg total) by mouth every 6 (six) hours. Patient not taking: Reported on 10/04/2014 05/16/14   Mellody DrownLauren Parker, PA-C  predniSONE (DELTASONE) 20 MG tablet Take 2 tablets (40 mg total) by mouth daily. Patient not taking: Reported on 10/04/2014 09/03/14   Harle BattiestElizabeth Tysinger, NP  QUEtiapine (SEROQUEL) 200 MG tablet Take 1 tablet (200 mg total) by mouth at bedtime. Patient not taking: Reported on 10/04/2014 01/12/14   Fransisca KaufmannLaura Davis, NP  traMADol (ULTRAM) 50 MG tablet Take 1 tablet (50 mg total) by mouth every 6 (six) hours as needed. Patient not taking: Reported on 10/04/2014 09/27/14   Chrissie NoaWilliam  Duncan Dansie, PA-C   BP 128/63 mmHg  Pulse 95  Temp(Src) 98.1 F (36.7 C) (Oral)  Resp 18  Ht 6' (1.829 m)  Wt 210 lb (95.255 kg)  BMI 28.47 kg/m2  SpO2 99% Physical Exam  Constitutional: He is oriented to person, place, and time. He appears well-developed and well-nourished. No distress.  HENT:  Head: Normocephalic and atraumatic.  Eyes: Conjunctivae and EOM are normal. Right eye exhibits no discharge. Left eye exhibits no discharge. No scleral icterus.  Neck: Normal range of motion. Neck supple. No tracheal deviation  present.  Cardiovascular: Normal rate, regular rhythm and normal heart sounds.  Exam reveals no gallop and no friction rub.   No murmur heard. Pulmonary/Chest: Effort normal and breath sounds normal. No respiratory distress. He has no wheezes.  Abdominal: Soft. He exhibits no distension. There is no tenderness.  Musculoskeletal: Normal range of motion.  Lumbar paraspinal muscles tender to palpation, no bony tenderness, step-offs, or gross abnormality or deformity of spine, patient is able to ambulate, moves all extremities  Bilateral great toe extension intact Bilateral plantar/dorsiflexion intact  Neurological: He is alert and oriented to person, place, and time. He has normal reflexes.  Sensation and strength intact bilaterally Symmetrical reflexes  Skin: Skin is warm. He is not diaphoretic.  Psychiatric: He has a normal mood and affect. His behavior is normal. Judgment and thought content normal.  Nursing note and vitals reviewed.   ED Course  Procedures (including critical care time) Labs Review Labs Reviewed  CBC WITH DIFFERENTIAL  BASIC METABOLIC PANEL  URINALYSIS, ROUTINE W REFLEX MICROSCOPIC    Imaging Review No results found.   EKG Interpretation None      MDM   Final diagnoses:  Weakness    Patient with persistent weakness, and back pain. He has been seen multiple times in the emergency department for the same problem. He states that he is now falling because of numbness in his right leg. I feel that it is appropriate to order an MRI at this time for further evaluation of the patient's symptoms. Patient discussed with Dr. Romeo AppleHarrison, who agrees with the plan.  Patient signed out to [place], PA-C, who will continue care.  Plan: Follow-up on MRI, if negative, discharged to home with symptomatic treatment.    Roxy Horsemanobert Shaleka Brines, PA-C 10/04/14 1629  Purvis SheffieldForrest Harrison, MD 10/05/14 [phone]

## 2014-10-04 NOTE — ED Notes (Signed)
Pt reports that his right hip has been hurting him and right foot numbness for awhile, states that he has been seen for this a couple of times before. No neuro deficits noted at triage. Pt laying in the floor at triage.

## 2014-10-04 NOTE — ED Notes (Signed)
Declined W/C at D/C and was escorted to lobby by RN. 

## 2014-10-05 ENCOUNTER — Encounter (HOSPITAL_COMMUNITY): Payer: Self-pay | Admitting: *Deleted

## 2014-10-05 ENCOUNTER — Emergency Department (HOSPITAL_COMMUNITY)
Admission: EM | Admit: 2014-10-05 | Discharge: 2014-10-05 | Disposition: A | Discharge status: Home / Self Care | Payer: Self-pay | Attending: Emergency Medicine | Admitting: Emergency Medicine

## 2014-10-05 DIAGNOSIS — Z79899 Other long term (current) drug therapy: Secondary | ICD-10-CM | POA: Insufficient documentation

## 2014-10-05 DIAGNOSIS — G8929 Other chronic pain: Secondary | ICD-10-CM

## 2014-10-05 DIAGNOSIS — K6289 Other specified diseases of anus and rectum: Secondary | ICD-10-CM | POA: Insufficient documentation

## 2014-10-05 DIAGNOSIS — F329 Major depressive disorder, single episode, unspecified: Secondary | ICD-10-CM | POA: Insufficient documentation

## 2014-10-05 DIAGNOSIS — R195 Other fecal abnormalities: Secondary | ICD-10-CM | POA: Insufficient documentation

## 2014-10-05 DIAGNOSIS — Z720 Tobacco use: Secondary | ICD-10-CM | POA: Insufficient documentation

## 2014-10-05 DIAGNOSIS — Z791 Long term (current) use of non-steroidal anti-inflammatories (NSAID): Secondary | ICD-10-CM | POA: Insufficient documentation

## 2014-10-05 MED ORDER — LIDOCAINE 5 % EX OINT: 1.0000 "application " | TOPICAL_OINTMENT | Freq: Two times a day (BID) | CUTANEOUS | Status: DC | PRN

## 2014-10-05 NOTE — ED Notes (Signed)
Patient refusing to get off of stretcher and into ED gown Patient states that he can't move or walk, however patient was ambulatory when EMS arrived on scene
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
## 2014-10-05 NOTE — ED Notes (Signed)
Bed: WA23 Expected date:  Expected time:  Means of arrival:  Comments: EMS- rectal pain 

## 2014-10-05 NOTE — ED Notes (Signed)
MD at bedside. 

## 2014-10-05 NOTE — ED Notes (Signed)
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
## 2014-10-05 NOTE — ED Notes (Signed)
Patient informed of DC Rx x 1 reviewed with patient--agreed and v/u Patient ambulatory around room while reviewing DC instructions Patient in NAD

## 2014-10-05 NOTE — Discharge Instructions (Signed)
We saw you in the ER for the rectal pain.  We are not sure what is causing your symptoms. The workup in the ER is not complete, and is limited to screening for life threatening and emergent conditions only, so please see a primary care doctor for further evaluation.  RESOURCE GUIDE  Chronic Pain Problems: Contact [facility]  [phone] Patients need to be referred by their primary care doctor.  Insufficient Money for Medicine: Contact United Way:  call "211."   No Primary Care Doctor: - Call Health Connect  [phone] - can help you locate a primary care doctor that  accepts your insurance, provides certain services, etc. - Physician Referral Service- [phone]  Agencies that provide inexpensive medical care: - [facility]  [phone] - [facility]  [phone] - [facility]  [phone] - Women's Clinic  [phone] - Planned Parenthood  [phone] Haynes Bast- [facility]  [phone]  Medicaid-accepting [facility] Providers: - [facility]- [address], [address]  [phone], Mon-Fri 9am-7pm, Sat 9am-1pm - [facility]- [address], [address] [place]  [phone] - [facility]- [address], [address] [place]  [phone] [facility]- [address] [place] [address]  [phone] - Renaye RakersVeita Bland- [address], [address], [phone]  Only accepts [place] patients after they have their name  applied to their card  Self Pay (no insurance) in [place]: - Sickle Cell Patients: Dr Willey BladeEric Dean, [facility]  [address], [phone] - [facility]- [address]  [phone]       Redge Gainer-     [facility] [place]- [address], [address]       -     [facility]- see information above (Speak to CitigroupPam H if you do not have insurance)       -  [facility] [place]- [address],  [phone]        -  [facility]- [address] [place] [address], [phone]       -  Dr Julio Sickssei-Bonsu-  [address], [address], [place], [phone]       -  [facility] - [address], [phone]       -  [facility]- [address] [place] [address], [phone], also [address], [phone]       -    [facility]- [address], [phone], 1st & 3rd Saturday        every month, 10am-1pm  [facility] [address] [place], [place] [address] [phone]  [facility] [address] [place], [place] [address] [phone]  1) Find a Doctor and Pay Out of Pocket Although you won't have to find out who is covered by your insurance plan, it is a good idea to ask around and get recommendations. You will then need to call the office and see if the doctor you have chosen will accept you as a new patient and what types of options they offer for patients who are self-pay. Some doctors offer discounts or will set up payment plans for their patients who do not have insurance, but you will need to ask so you aren't surprised when you get to your appointment.  2) Contact Your Local Health  Department Not all health departments have doctors that can see patients for sick visits, but many do, so it is worth a call to see if yours does. If you don't know where your local health department is, you can check in your phone book. The CDC also has a tool to help you locate your state's health department, and many state websites also have listings of all of their local health departments.  3) Find a Walk-in Clinic If your illness is not likely to be very severe or complicated, you may want to try a walk in clinic. These are popping up all over the country in pharmacies, drugstores, and shopping centers. They're usually staffed by nurse practitioners or physician assistants that have been trained to treat common illnesses and complaints.  They're usually fairly quick and inexpensive. However, if you have serious medical issues or chronic medical problems, these are probably not your best option  STD Testing - [facility] Department of [facility] [place], STD Clinic, [address], [place], phone [phone] or [phone].  Monday - Friday, call for an appointment. [facility]- [place] Department of [facility] [place], STD Clinic, [place], [place], phone [phone] or [phone].  Monday - Friday, call for an appointment.  Abuse/Neglect: [facility]- [place] Child Abuse Hotline [phone] [facility]- [place] Child Abuse Hotline (639) [phone] (After Hours)  Emergency Shelter:  [facility] [phone]  Maternity Homes: - [facility] [phone] - [facility] [phone]  MRSA Hotline #:   812-472-4877781 168 5444  Dental Assistance If unable to pay or uninsured, contact:  [facility] to become qualified for the adult dental clinic.  Patients with Medicaid: [facility] [phone], [phone] [address], [phone]  If unable to pay, or uninsured, contact [facility] [phone] in [place], [phone] in [place] [facility] [place]) to become qualified for the adult dental clinic  [facility] [address] [place], [place] [address] [phone] [email or web address]  Other ProofreaderLow-Cost Community Dental Services: - [facility]- [address], [place], [place], [address], [phone], [address]. [phone], 2nd and 4th Thursday of the month at 6:30am.  10 clients each day by appointment, can sometimes see walk-in patients if someone does not show for an appointment. [facility]- [address], [place], [place], [address], [phone] - [facility]- [address], [place], [place], [address], [phone] - [place] [facility]- [phone] [facility] [place]  [facility]- [phone] [facility]- [phone]

## 2014-10-06 ENCOUNTER — Emergency Department (HOSPITAL_COMMUNITY)
Admission: EM | Admit: 2014-10-06 | Discharge: 2014-10-06 | Disposition: A | Discharge status: Home / Self Care | Payer: Self-pay | Attending: Emergency Medicine | Admitting: Emergency Medicine

## 2014-10-06 ENCOUNTER — Encounter (HOSPITAL_COMMUNITY): Payer: Self-pay | Admitting: Emergency Medicine

## 2014-10-06 DIAGNOSIS — Z791 Long term (current) use of non-steroidal anti-inflammatories (NSAID): Secondary | ICD-10-CM | POA: Insufficient documentation

## 2014-10-06 DIAGNOSIS — Z720 Tobacco use: Secondary | ICD-10-CM | POA: Insufficient documentation

## 2014-10-06 DIAGNOSIS — M5431 Sciatica, right side: Secondary | ICD-10-CM

## 2014-10-06 DIAGNOSIS — M5441 Lumbago with sciatica, right side: Secondary | ICD-10-CM | POA: Insufficient documentation

## 2014-10-06 DIAGNOSIS — F319 Bipolar disorder, unspecified: Secondary | ICD-10-CM | POA: Insufficient documentation

## 2014-10-06 DIAGNOSIS — Z79899 Other long term (current) drug therapy: Secondary | ICD-10-CM | POA: Insufficient documentation

## 2014-10-06 MED ORDER — HYDROCODONE-ACETAMINOPHEN 5-325 MG PO TABS: 1.0000 | ORAL_TABLET | ORAL | Status: DC | PRN

## 2014-10-06 MED ORDER — PREDNISONE (PAK) 10 MG PO TABS: ORAL_TABLET | ORAL | Status: DC

## 2014-10-06 MED ORDER — HYDROMORPHONE HCL 2 MG/ML IJ SOLN
2.0000 mg | Freq: Once | INTRAMUSCULAR | Status: AC
  Administered 2014-10-06: 2 mg via INTRAMUSCULAR
  Filled 2014-10-06: qty 1

## 2014-10-06 NOTE — ED Notes (Signed)
MD at bedside. 

## 2014-10-06 NOTE — ED Notes (Signed)
Pt BIB EMS, reports chronic back pain, sciatica, going into R leg. Pt was seen here yesterday for same. Ambulated minimally on scene

## 2014-10-06 NOTE — ED Provider Notes (Signed)
CSN: [id number]     Arrival date & time 10/06/14  0522 History   First MD Initiated Contact with Patient 10/06/14 0701     Chief Complaint  Patient presents with  . Back Pain   HPI Patient presents to the emergency room with complaints of persistent low back pain radiating down his right leg. The patient has had this trouble ongoing for the past several months. He feels that the pain is progressively getting worse. The patient says the pain is sharp and burning rating down his lower back and leg. He feels that his right leg is becoming numb primarily on the lateral aspect. He feels that that right leg is also weaker than the left. He denies any bowel or bladder incontinence. Past Medical History  Diagnosis Date  . Medical history non-contributory   . Depression   . Bipolar 1 disorder    Past Surgical History  Procedure Laterality Date  . Acne cyst removal      back   History reviewed. No pertinent family history. History  Substance Use Topics  . Smoking status: Heavy Tobacco Smoker -- 1.50 packs/day    Types: Cigarettes  . Smokeless tobacco: Never Used  . Alcohol Use: No    Review of Systems  All other systems reviewed and are negative.     Allergies  Review of patient's allergies indicates no known allergies.  Home Medications   Prior to Admission medications   Medication Sig Start Date End Date Taking? Authorizing Provider  acetaminophen (TYLENOL) 325 MG tablet Take 975 mg by mouth every 6 (six) hours as needed for mild pain.   Yes Historical Provider, MD  cephALEXin (KEFLEX) 500 MG capsule Take 1 capsule (500 mg total) by mouth 3 (three) times daily. Patient not taking: Reported on 10/06/2014 05/16/14   Mellody DrownLauren Parker, PA-C  FLUoxetine (PROZAC) 40 MG capsule Take 1 capsule (40 mg total) by mouth daily. Patient not taking: Reported on 10/04/2014 01/12/14   Fransisca KaufmannLaura Davis, NP  hydrOXYzine (ATARAX/VISTARIL) 25 MG tablet Take 1 tablet (25 mg total) by mouth every 6 (six)  hours. Patient not taking: Reported on 10/04/2014 05/16/14   Mellody DrownLauren Parker, PA-C  lidocaine (XYLOCAINE) 5 % ointment Apply 1 application topically 2 (two) times daily as needed. 10/05/14   Derwood KaplanAnkit Nanavati, MD  naproxen (NAPROSYN) 500 MG tablet Take 1 tablet (500 mg total) by mouth 2 (two) times daily with a meal. 09/27/14   Einar GipWilliam Duncan Dansie, PA-C  predniSONE (DELTASONE) 20 MG tablet Take 2 tablets (40 mg total) by mouth daily. Patient not taking: Reported on 10/04/2014 09/03/14   Harle BattiestElizabeth Tysinger, NP  QUEtiapine (SEROQUEL) 200 MG tablet Take 1 tablet (200 mg total) by mouth at bedtime. Patient not taking: Reported on 10/04/2014 01/12/14   Fransisca KaufmannLaura Davis, NP  traMADol (ULTRAM) 50 MG tablet Take 1 tablet (50 mg total) by mouth every 6 (six) hours as needed. Patient not taking: Reported on 10/04/2014 09/27/14   Einar GipWilliam Duncan Dansie, PA-C  traZODone (DESYREL) 50 MG tablet Take 1 tablet (50 mg total) by mouth at bedtime. 01/12/14   Fransisca KaufmannLaura Davis, NP   BP 108/76 mmHg  Pulse 64  Temp(Src) 98.7 F (37.1 C) (Oral)  Resp 20  SpO2 100% Physical Exam  Constitutional: He appears well-developed and well-nourished. No distress.  HENT:  Head: Normocephalic and atraumatic.  Right Ear: External ear normal.  Left Ear: External ear normal.  Eyes: Conjunctivae are normal. Right eye exhibits no discharge. Left eye exhibits no discharge. No scleral  icterus.  Neck: Neck supple. No tracheal deviation present.  Cardiovascular: Normal rate.   Pulmonary/Chest: Effort normal. No stridor. No respiratory distress.  Musculoskeletal: He exhibits no edema.       Lumbar back: He exhibits no tenderness and no bony tenderness.  Pain with straight leg raise, pain is relieved with patient having his right leg extended and not flexed at the hip  Neurological: He is alert. Cranial nerve deficit: no gross deficits.  Decreased sensation lateral aspect right foot, decreased strength plantar and dorsiflexion right foot  Skin: Skin  is warm and dry. No rash noted.  Psychiatric: He has a normal mood and affect.  Nursing note and vitals reviewed.   ED Course  Procedures (including critical care time) Labs Review Labs Reviewed - No data to display  Imaging Review Mr Lumbar Spine Wo Contrast  10/04/2014   CLINICAL DATA:  38 year old male with right leg pain and weakness. Problem for several months, progressively worse.  EXAM: MRI LUMBAR SPINE WITHOUT CONTRAST  TECHNIQUE: Multiplanar, multisequence MR imaging of the lumbar spine was performed. No intravenous contrast was administered.  COMPARISON:  None.  FINDINGS: Exam is motion degraded.  Last fully open disk space is labeled L5-S1. Present examination incorporates from T11-12 disc space through the S3 level.  Conus L1 level.  Visualized paravertebral structures are unremarkable.  Regions of slightly heterogeneous bone marrow appear to represent focal fatty deposits.  Baseline mild narrowing of the spinal canal secondary to short pedicles otherwise T11-12 through L3-4 unremarkable.  L4-5: Mild bulge. Slightly short pedicles. Minimal facet joint degenerative changes. Mild to slightly moderate spinal stenosis. Very mild bilateral foraminal narrowing.  L5-S1: Bulge with endplate reactive changes greater left lateral position. Superimposed right paracentral protrusion with slight impression upon the right ventral aspect of the thecal sac and posterior displacement of the upper right S1 nerve root.  IMPRESSION: Exam is motion degraded.  Baseline narrowing of the spinal canal secondary to short pedicles.  L5-S1 bulge with superimposed right paracentral protrusion with slight impression upon the right ventral aspect of the thecal sac and posterior displacement of the upper right S1 nerve root.  L4-5 mild to slightly moderate multifactorial spinal stenosis.  Please see above.  Please see above.   Electronically Signed   By: Bridgett LarssonSteve  Olson M.D.   On: 10/04/2014 19:55     MDM   Final  diagnoses:  Sciatica, right    Pt's MRI does correlate this his symptoms.  Pt has not been able to fill his medications because of cost issues.  He was previously referred to [place] and wellness centers as well as spine specialists but has not made any follow up appointments.  I explained the importance of outpatient follow up.  I can give him something for pain and although there is no emergency indication for surgery his symptoms are progressing and he may end up needing discectomy.  Discussed with Dr Danielle DessElsner.  He reviewed the films.  Does not think patient needs imminent surgery.  May benefit from epidural injection.  I will try to contact the IR physician on call.    D/w Dr Rica RecordsHassel.  Outpatient epidural ordered.  They will call to schedule.  Aware of the insurance status  Linwood DibblesJon Akashdeep Chuba, MD 10/06/14 [phone]

## 2014-10-06 NOTE — Discharge Instructions (Signed)
Sciatica Sciatica is pain, weakness, numbness, or tingling along the path of the sciatic nerve. The nerve starts in the lower back and runs down the back of each leg. The nerve controls the muscles in the lower leg and in the back of the knee, while also providing sensation to the back of the thigh, lower leg, and the sole of your foot. Sciatica is a symptom of another medical condition. For instance, nerve damage or certain conditions, such as a herniated disk or bone spur on the spine, pinch or put pressure on the sciatic nerve. This causes the pain, weakness, or other sensations normally associated with sciatica. Generally, sciatica only affects one side of the body. CAUSES   Herniated or slipped disc.  Degenerative disk disease.  A pain disorder involving the narrow muscle in the buttocks (piriformis syndrome).  Pelvic injury or fracture.  Pregnancy.  Tumor (rare). SYMPTOMS  Symptoms can vary from mild to very severe. The symptoms usually travel from the low back to the buttocks and down the back of the leg. Symptoms can include:  Mild tingling or dull aches in the lower back, leg, or hip.  Numbness in the back of the calf or sole of the foot.  Burning sensations in the lower back, leg, or hip.  Sharp pains in the lower back, leg, or hip.  Leg weakness.  Severe back pain inhibiting movement. These symptoms may get worse with coughing, sneezing, laughing, or prolonged sitting or standing. Also, being overweight may worsen symptoms. DIAGNOSIS  Your caregiver will perform a physical exam to look for common symptoms of sciatica. He or she may ask you to do certain movements or activities that would trigger sciatic nerve pain. Other tests may be performed to find the cause of the sciatica. These may include:  Blood tests.  X-rays.  Imaging tests, such as an MRI or CT scan. TREATMENT  Treatment is directed at the cause of the sciatic pain. Sometimes, treatment is not necessary  and the pain and discomfort goes away on its own. If treatment is needed, your caregiver may suggest:  Over-the-counter medicines to relieve pain.  Prescription medicines, such as anti-inflammatory medicine, muscle relaxants, or narcotics.  Applying heat or ice to the painful area.  Steroid injections to lessen pain, irritation, and inflammation around the nerve.  Reducing activity during periods of pain.  Exercising and stretching to strengthen your abdomen and improve flexibility of your spine. Your caregiver may suggest losing weight if the extra weight makes the back pain worse.  Physical therapy.  Surgery to eliminate what is pressing or pinching the nerve, such as a bone spur or part of a herniated disk. HOME CARE INSTRUCTIONS   Only take over-the-counter or prescription medicines for pain or discomfort as directed by your caregiver.  Apply ice to the affected area for 20 minutes, 3-4 times a day for the first 48-72 hours. Then try heat in the same way.  Exercise, stretch, or perform your usual activities if these do not aggravate your pain.  Attend physical therapy sessions as directed by your caregiver.  Keep all follow-up appointments as directed by your caregiver.  Do not wear high heels or shoes that do not provide proper support.  Check your mattress to see if it is too soft. A firm mattress may lessen your pain and discomfort. SEEK IMMEDIATE MEDICAL CARE IF:   You lose control of your bowel or bladder (incontinence).  You have increasing weakness in the lower back, pelvis, buttocks,   or legs.  You have redness or swelling of your back.  You have a burning sensation when you urinate.  You have pain that gets worse when you lie down or awakens you at night.  Your pain is worse than you have experienced in the past.  Your pain is lasting longer than 4 weeks.  You are suddenly losing weight without reason. MAKE SURE YOU:  Understand these  instructions.  Will watch your condition.  Will get help right away if you are not doing well or get worse. Document Released: 10/06/2001 Document Revised: 04/12/2012 Document Reviewed: 02/21/2012 ExitCare Patient Information 2015 [employer]. This information is not intended to replace advice given to you by your health care provider. Make sure you discuss any questions you have with your health care provider.  

## 2014-10-06 NOTE — ED Notes (Signed)
He is aware that we are consulting [facility].  Dr. Lynelle DoctorKnapp states he will give pt. Periodic updates.  He thanks us for our care, especially for the pain med.

## 2017-05-29 ENCOUNTER — Emergency Department (HOSPITAL_COMMUNITY): Payer: Self-pay

## 2017-05-29 ENCOUNTER — Encounter (HOSPITAL_COMMUNITY): Payer: Self-pay

## 2017-05-29 ENCOUNTER — Emergency Department (HOSPITAL_COMMUNITY)
Admission: EM | Admit: 2017-05-29 | Discharge: 2017-05-29 | Disposition: A | Discharge status: Home / Self Care | Payer: Self-pay | Attending: Emergency Medicine | Admitting: Emergency Medicine

## 2017-05-29 DIAGNOSIS — W0110XA Fall on same level from slipping, tripping and stumbling with subsequent striking against unspecified object, initial encounter: Secondary | ICD-10-CM | POA: Insufficient documentation

## 2017-05-29 DIAGNOSIS — Y92002 Bathroom of unspecified non-institutional (private) residence single-family (private) house as the place of occurrence of the external cause: Secondary | ICD-10-CM | POA: Insufficient documentation

## 2017-05-29 DIAGNOSIS — S2231XA Fracture of one rib, right side, initial encounter for closed fracture: Secondary | ICD-10-CM | POA: Insufficient documentation

## 2017-05-29 DIAGNOSIS — F17210 Nicotine dependence, cigarettes, uncomplicated: Secondary | ICD-10-CM | POA: Insufficient documentation

## 2017-05-29 DIAGNOSIS — Y9301 Activity, walking, marching and hiking: Secondary | ICD-10-CM | POA: Insufficient documentation

## 2017-05-29 DIAGNOSIS — Y998 Other external cause status: Secondary | ICD-10-CM | POA: Insufficient documentation

## 2017-05-29 MED ORDER — KETOROLAC TROMETHAMINE 30 MG/ML IJ SOLN
30.0000 mg | Freq: Once | INTRAMUSCULAR | Status: AC
  Administered 2017-05-29: 30 mg via INTRAMUSCULAR
  Filled 2017-05-29: qty 1

## 2017-05-29 MED ORDER — TRAMADOL HCL 50 MG PO TABS: 50.0000 mg | ORAL_TABLET | Freq: Four times a day (QID) | ORAL | 0 refills | Status: DC | PRN

## 2017-05-29 NOTE — ED Notes (Signed)
Declined W/C at D/C and was escorted to lobby by RN. 

## 2017-05-29 NOTE — Discharge Instructions (Signed)
Your xrays show that you have a Rib Fracture A rib fracture is a break or crack in one of the bones of the ribs. The ribs are like a cage that goes around your upper chest. A broken or cracked rib is often painful, but most do not cause other problems. Most rib fractures heal on their own in 1-3 months. Follow these instructions at home: Use the incentive spirometer 10 times per hour to prevent infection in the lung. Avoid activities that cause pain to the injured area. Protect your injured area. Slowly increase activity as told by your doctor. Take Ibuprofen for pain (600mg  every 6 hours with max dose of 2400mg  in 24 hours). For breakthrough pain use Tramadol.  Put ice on the injured area for the first 1-2 days after you have been treated or as told by your doctor. Put ice in a plastic bag. Place a towel between your skin and the bag. Leave the ice on for 15-20 minutes at a time, every 2 hours while you are awake. Do deep breathing as told by your doctor. You may be told to: Take deep breaths many times a day. Cough many times a day while hugging a pillow. Use a device (incentive spirometer) to perform deep breathing many times a day. Drink enough fluids to keep your pee (urine) clear or pale yellow. Do not wear a rib belt or binder. These do not allow you to breathe deeply. Get help right away if: You have a fever. You have trouble breathing. You cannot stop coughing. You cough up thick or bloody spit (mucus). You feel sick to your stomach (nauseous), throw up (vomit), or have belly (abdominal) pain. Your pain gets worse and medicine does not help.

## 2017-05-29 NOTE — ED Triage Notes (Signed)
Patient fell 2 nights ago and complains of right sided rib pain with movement and inspiration. NAD, No SOB

## 2017-05-29 NOTE — ED Provider Notes (Signed)
[facility]-EMERGENCY DEPT Provider Note   CSN: [id number] Arrival date & time: 05/29/17  1419     History   Chief Complaint Chief Complaint  Patient presents with  . fall/rib pain    HPI Benjamin Butler is a 41 y.o. male who presents to the emergency department today for right rib pain 2 days ago. The patient states that 2 nights ago he was walking to the bathroom in the middle of the night and tripped over a fan causing him to fall onto his right side. Since then he has had right rib pain that is worsened with deep inspiration. The patient says that he has no shortness of breath with this. He has not taken anything for this.  HPI  Past Medical History:  Diagnosis Date  . Bipolar 1 disorder (HCC)   . Depression   . Medical history non-contributory     Patient Active Problem List   Diagnosis Date Noted  . Psychosis 01/08/2014  . Cannabis abuse 12/21/2013  . Major depressive disorder, single episode, severe, specified as with psychotic behavior 12/20/2013  . Homicidal ideation 12/19/2013  . Intermittent explosive disorder 12/19/2013    Past Surgical History:  Procedure Laterality Date  . ACNE CYST REMOVAL     back       Home Medications    Prior to Admission medications   Medication Sig Start Date End Date Taking? Authorizing Provider  cephALEXin (KEFLEX) 500 MG capsule Take 1 capsule (500 mg total) by mouth 3 (three) times daily. Patient not taking: Reported on 10/06/2014 05/16/14   Mellody DrownParker, Lauren, PA-C  FLUoxetine (PROZAC) 40 MG capsule Take 1 capsule (40 mg total) by mouth daily. Patient not taking: Reported on 10/04/2014 01/12/14   Thermon Leylandavis, Laura A, NP  HYDROcodone-acetaminophen (NORCO/VICODIN) 5-325 MG per tablet Take 1-2 tablets by mouth every 4 (four) hours as needed. 10/06/14   Linwood DibblesKnapp, Jon, MD  hydrOXYzine (ATARAX/VISTARIL) 25 MG tablet Take 1 tablet (25 mg total) by mouth every 6 (six) hours. Patient not taking: Reported on 10/04/2014 05/16/14   Mellody DrownParker, Lauren, PA-C    lidocaine (XYLOCAINE) 5 % ointment Apply 1 application topically 2 (two) times daily as needed. 10/05/14   Derwood KaplanNanavati, Ankit, MD  naproxen (NAPROSYN) 500 MG tablet Take 1 tablet (500 mg total) by mouth 2 (two) times daily with a meal. 09/27/14   Everlene Farrieransie, William, PA-C  predniSONE (STERAPRED UNI-PAK) 10 MG tablet Take 6 tabs by mouth daily  for 2 days, then 5 tabs for 2 days, then 4 tabs for 2 days, then 3 tabs for 2 days, 2 tabs for 2 days, then 1 tab by mouth daily for 2 days 10/06/14   Linwood DibblesKnapp, Jon, MD  QUEtiapine (SEROQUEL) 200 MG tablet Take 1 tablet (200 mg total) by mouth at bedtime. Patient not taking: Reported on 10/04/2014 01/12/14   Thermon Leylandavis, Laura A, NP  traMADol (ULTRAM) 50 MG tablet Take 1 tablet (50 mg total) by mouth every 6 (six) hours as needed. Patient not taking: Reported on 10/04/2014 09/27/14   Everlene Farrieransie, William, PA-C  traZODone (DESYREL) 50 MG tablet Take 1 tablet (50 mg total) by mouth at bedtime. 01/12/14   Thermon Leylandavis, Laura A, NP    Family History No family history on file.  Social History Social History  Substance Use Topics  . Smoking status: Heavy Tobacco Smoker    Packs/day: 1.50    Types: Cigarettes  . Smokeless tobacco: Never Used  . Alcohol use No     Allergies   Patient  has no known allergies.   Review of Systems Review of Systems  Respiratory: Negative for chest tightness and shortness of breath.   Gastrointestinal: Negative for abdominal pain.  Musculoskeletal:       Rib pain     Physical Exam Updated Vital Signs BP 111/70   Pulse 87   Temp 98.2 F (36.8 C) (Oral)   Resp 16   SpO2 100%   Physical Exam  Constitutional: He appears well-developed and well-nourished.  HENT:  Head: Normocephalic and atraumatic.  Right Ear: External ear normal.  Left Ear: External ear normal.  Nose: Nose normal.  Mouth/Throat: Oropharynx is clear and moist.  Eyes: Pupils are equal, round, and reactive to light. Right eye exhibits no discharge. Left eye exhibits no  discharge. No scleral icterus.  Neck: Neck supple.  Cardiovascular: Normal rate, regular rhythm and intact distal pulses.   No murmur heard. Pulses:      Radial pulses are 2+ on the right side, and 2+ on the left side.       Dorsalis pedis pulses are 2+ on the right side, and 2+ on the left side.       Posterior tibial pulses are 2+ on the right side, and 2+ on the left side.  No lower extremity swelling or edema. Calves symmetric in size bilaterally.  Pulmonary/Chest: Effort normal and breath sounds normal. No respiratory distress. He exhibits tenderness (pain over the right side and posterior ribs).  No flail chest  Abdominal: Soft. Bowel sounds are normal. There is no tenderness. There is no rebound and no guarding.  Musculoskeletal: He exhibits no edema.  Lymphadenopathy:    He has no cervical adenopathy.  Neurological: He is alert.  Skin: Skin is warm and dry. No rash noted. He is not diaphoretic.  Psychiatric: He has a normal mood and affect.  Nursing note and vitals reviewed.    ED Treatments / Results  Labs (all labs ordered are listed, but only abnormal results are displayed) Labs Reviewed - No data to display  EKG  EKG Interpretation None       Radiology Dg Chest 2 View  Result Date: 05/29/2017 CLINICAL DATA:  Patient fell 2 nights ago when he tripped over a fan trying to go to the bathroom. Pt now complains of right sided rib pain mid-thorax with any movement and inspiration. NAD, No SOB EXAM: CHEST  2 VIEW COMPARISON:  01/04/2007 FINDINGS: The heart size and mediastinal contours are within normal limits. Both lungs are clear. Contour abnormality of the right posterior eleventh rib raises the question of a possible acute fracture. IMPRESSION: 1.  No evidence for acute cardiopulmonary abnormality. 2. Question of acute fracture the right posterior eleventh rib. Electronically Signed   By: Norva Pavlov M.D.   On: 05/29/2017 15:28    Procedures Procedures (including  critical care time)  Procedure Note:  Definitive Fracture Care:  Definitive fracture care performred for the rib.  This included analgesia in the ED, incentive spiromoterly,  and prescriptions for outpatient pain control which have been provided.  I have counseled the pt on possible complications of the fractures and signs and symptoms which would mandate return for further care as well as the utility of RICE therapy. The patient has expressed their understanding.   Medications Ordered in ED Medications  ketorolac (TORADOL) 30 MG/ML injection 30 mg (not administered)     Initial Impression / Assessment and Plan / ED Course  I have reviewed the triage vital signs and  the nursing notes.  Pertinent labs & imaging results that were available during my care of the patient were reviewed by me and considered in my medical decision making (see chart for details).      Pt with nondisplaced rib fractures on x-ray. Tender to palpation in the area. Patient with good tidal volume, no hemoptysis, no decreased breath sounds and no pneumothorax on x-ray. No flail chest on exam. Patient given definitive rib structures including use of incentive spirometer 10 times per hour to prevent pneumonia; use of pillow when coughing and taking NSAIDs along with pain medications. Patient also advised to followup with orthopedics if not improved in one week.  I have also discussed reasons to return immediately to the ER including difficulty breathing, hemoptysis.  Patient expresses understanding and agrees with plan.   Final Clinical Impressions(s) / ED Diagnoses   Final diagnoses:  Closed fracture of one rib of right side, initial encounter    New Prescriptions New Prescriptions   No medications on file     Princella PellegriniMaczis, Wisdom Seybold M, PA-C 05/29/17 1705    Gerhard MunchLockwood, Robert, MD 05/30/17 1650

## 2018-03-17 ENCOUNTER — Emergency Department (HOSPITAL_COMMUNITY)
Admission: EM | Admit: 2018-03-17 | Discharge: 2018-03-17 | Disposition: A | Discharge status: Home / Self Care | Payer: Self-pay | Attending: Emergency Medicine | Admitting: Emergency Medicine

## 2018-03-17 ENCOUNTER — Emergency Department (HOSPITAL_COMMUNITY): Payer: Self-pay

## 2018-03-17 ENCOUNTER — Encounter (HOSPITAL_COMMUNITY): Payer: Self-pay | Admitting: Emergency Medicine

## 2018-03-17 ENCOUNTER — Other Ambulatory Visit: Payer: Self-pay

## 2018-03-17 DIAGNOSIS — Z79899 Other long term (current) drug therapy: Secondary | ICD-10-CM | POA: Insufficient documentation

## 2018-03-17 DIAGNOSIS — F17210 Nicotine dependence, cigarettes, uncomplicated: Secondary | ICD-10-CM | POA: Insufficient documentation

## 2018-03-17 DIAGNOSIS — G44229 Chronic tension-type headache, not intractable: Secondary | ICD-10-CM | POA: Insufficient documentation

## 2018-03-17 MED ORDER — KETOROLAC TROMETHAMINE 30 MG/ML IJ SOLN: 30.0000 mg | Freq: Once | INTRAMUSCULAR | Status: DC

## 2018-03-17 MED ORDER — ONDANSETRON HCL 4 MG/2ML IJ SOLN
4.0000 mg | Freq: Once | INTRAMUSCULAR | Status: DC
  Filled 2018-03-17: qty 2

## 2018-03-17 MED ORDER — SODIUM CHLORIDE 0.9 % IV BOLUS: 1000.0000 mL | Freq: Once | INTRAVENOUS | Status: DC

## 2018-03-17 MED ORDER — BUTALBITAL-APAP-CAFFEINE 50-325-40 MG PO TABS: 1.0000 | ORAL_TABLET | Freq: Four times a day (QID) | ORAL | 0 refills | Status: AC | PRN

## 2018-03-17 MED ORDER — KETOROLAC TROMETHAMINE 30 MG/ML IJ SOLN
30.0000 mg | Freq: Once | INTRAMUSCULAR | Status: DC
Start: 2018-03-17 — End: 2018-03-17
  Filled 2018-03-17: qty 1

## 2018-03-17 MED ORDER — METOCLOPRAMIDE HCL 5 MG/ML IJ SOLN: 10.0000 mg | Freq: Once | INTRAMUSCULAR | Status: DC
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
## 2018-03-17 NOTE — Discharge Instructions (Signed)
Follow-up at the wellness center listed below for further evaluation. °

## 2018-03-17 NOTE — ED Notes (Signed)
D/c reviewed with patient 
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
## 2018-03-17 NOTE — ED Notes (Signed)
ED Provider at bedside. 

## 2019-06-21 ENCOUNTER — Emergency Department (HOSPITAL_COMMUNITY)
Admission: EM | Admit: 2019-06-21 | Discharge: 2019-06-21 | Disposition: A | Discharge status: Home / Self Care | Payer: Self-pay | Attending: Emergency Medicine | Admitting: Emergency Medicine

## 2019-06-21 ENCOUNTER — Other Ambulatory Visit: Payer: Self-pay

## 2019-06-21 DIAGNOSIS — F1290 Cannabis use, unspecified, uncomplicated: Secondary | ICD-10-CM | POA: Insufficient documentation

## 2019-06-21 DIAGNOSIS — F319 Bipolar disorder, unspecified: Secondary | ICD-10-CM | POA: Insufficient documentation

## 2019-06-21 DIAGNOSIS — Z79899 Other long term (current) drug therapy: Secondary | ICD-10-CM | POA: Insufficient documentation

## 2019-06-21 DIAGNOSIS — K047 Periapical abscess without sinus: Secondary | ICD-10-CM | POA: Insufficient documentation

## 2019-06-21 DIAGNOSIS — R51 Headache: Secondary | ICD-10-CM | POA: Insufficient documentation

## 2019-06-21 DIAGNOSIS — K029 Dental caries, unspecified: Secondary | ICD-10-CM | POA: Insufficient documentation

## 2019-06-21 DIAGNOSIS — F17210 Nicotine dependence, cigarettes, uncomplicated: Secondary | ICD-10-CM | POA: Insufficient documentation

## 2019-06-21 MED ORDER — PENICILLIN V POTASSIUM 250 MG PO TABS
500.0000 mg | ORAL_TABLET | Freq: Once | ORAL | Status: AC
  Administered 2019-06-21: 22:00:00 500 mg via ORAL
  Filled 2019-06-21: qty 2

## 2019-06-21 MED ORDER — NAPROXEN 250 MG PO TABS
500.0000 mg | ORAL_TABLET | Freq: Once | ORAL | Status: AC
  Administered 2019-06-21: 500 mg via ORAL
  Filled 2019-06-21: qty 2

## 2019-06-21 MED ORDER — NAPROXEN 500 MG PO TABS: 500.0000 mg | ORAL_TABLET | Freq: Two times a day (BID) | ORAL | 0 refills | Status: DC

## 2019-06-21 MED ORDER — PENICILLIN V POTASSIUM 500 MG PO TABS: 500.0000 mg | ORAL_TABLET | Freq: Four times a day (QID) | ORAL | 0 refills | Status: DC

## 2019-06-21 NOTE — ED Triage Notes (Signed)
Pt arrives POV for eval of L sided dental pain w/ HA x 1 week, Reports he has tried OTC tylenol at home w/ no relief. Pt reports hx of bad tooth to L side. Denies fevers at home

## 2019-06-21 NOTE — ED Provider Notes (Signed)
[facility] EMERGENCY DEPARTMENT Provider Note   CSN: [id number] Arrival date & time: 06/21/19  2158     History   Chief Complaint Chief Complaint  Patient presents with  . Dental Pain  . Headache    HPI Benjamin Butler is a 43 y.o. male.     The history is provided by the patient and medical records.  Dental Pain Headache    43 y.o. M with history of bipolar disorder, depression, presenting to the ED for dental pain.  States this teeth have been hurting him for the past week-- he has several bad ones.  States they keep breaking off piece by piece over the past few months.  He denies fever or trouble swallowing.  States pain in his mouth is causing him to have a headache.  He has been taking tylenol with minimal relief at home.  Past Medical History:  Diagnosis Date  . Bipolar 1 disorder ([place])   . Depression   . Medical history non-contributory     Patient Active Problem List   Diagnosis Date Noted  . Psychosis ([place]) 01/08/2014  . Cannabis abuse 12/21/2013  . Major depressive disorder, single episode, severe, specified as with psychotic behavior 12/20/2013  . Homicidal ideation 12/19/2013  . Intermittent explosive disorder 12/19/2013    Past Surgical History:  Procedure Laterality Date  . ACNE CYST REMOVAL     back        Home Medications    Prior to Admission medications   Medication Sig Start Date End Date Taking? Authorizing Provider  cephALEXin (KEFLEX) 500 MG capsule Take 1 capsule (500 mg total) by mouth 3 (three) times daily. Patient not taking: Reported on 10/06/2014 05/16/14   Harvie Heck, PA-C  FLUoxetine (PROZAC) 40 MG capsule Take 1 capsule (40 mg total) by mouth daily. Patient not taking: Reported on 10/04/2014 01/12/14   Niel Hummer, NP  HYDROcodone-acetaminophen (NORCO/VICODIN) 5-325 MG per tablet Take 1-2 tablets by mouth every 4 (four) hours as needed. 10/06/14   Dorie Rank, MD  hydrOXYzine (ATARAX/VISTARIL) 25 MG  tablet Take 1 tablet (25 mg total) by mouth every 6 (six) hours. Patient not taking: Reported on 10/04/2014 05/16/14   Harvie Heck, PA-C  lidocaine (XYLOCAINE) 5 % ointment Apply 1 application topically 2 (two) times daily as needed. 10/05/14   Varney Biles, MD  naproxen (NAPROSYN) 500 MG tablet Take 1 tablet (500 mg total) by mouth 2 (two) times daily with a meal. 09/27/14   Waynetta Pean, PA-C  predniSONE (STERAPRED UNI-PAK) 10 MG tablet Take 6 tabs by mouth daily  for 2 days, then 5 tabs for 2 days, then 4 tabs for 2 days, then 3 tabs for 2 days, 2 tabs for 2 days, then 1 tab by mouth daily for 2 days 10/06/14   Dorie Rank, MD  QUEtiapine (SEROQUEL) 200 MG tablet Take 1 tablet (200 mg total) by mouth at bedtime. Patient not taking: Reported on 10/04/2014 01/12/14   Niel Hummer, NP  traMADol (ULTRAM) 50 MG tablet Take 1 tablet (50 mg total) by mouth every 6 (six) hours as needed. 05/29/17   Maczis, Barth Kirks, PA-C  traZODone (DESYREL) 50 MG tablet Take 1 tablet (50 mg total) by mouth at bedtime. 01/12/14   Niel Hummer, NP    Family History No family history on file.  Social History Social History   Tobacco Use  . Smoking status: Heavy Tobacco Smoker    Packs/day: 1.50  Types: Cigarettes  . Smokeless tobacco: Never Used  Substance Use Topics  . Alcohol use: No  . Drug use: Yes    Types: Marijuana     Allergies   Patient has no known allergies.   Review of Systems Review of Systems  HENT: Positive for dental problem.   All other systems reviewed and are negative.    Physical Exam Updated Vital Signs BP 130/85 (BP Location: Right Arm)   Pulse 80   Temp 98.6 F (37 C) (Oral)   Resp 18   Ht 6\' 1"  (1.854 m)   Wt 95.3 kg   SpO2 97%   BMI 27.71 kg/m   Physical Exam Vitals signs and nursing note reviewed.  Constitutional:      General: He is not in acute distress.    Appearance: He is well-developed. He is not diaphoretic.  HENT:     Head: Normocephalic  and atraumatic.     Comments: Teeth largely in fair dentition,  Multiple upper premolars and molars are broken and decayed, surrounding gingiva appears inflamed but no discrete abscess, handling secretions appropriately, no trismus, no facial or neck swelling, normal phonation without stridor    Right Ear: External ear normal.     Left Ear: External ear normal.  Eyes:     Conjunctiva/sclera: Conjunctivae normal.     Pupils: Pupils are equal, round, and reactive to light.  Neck:     Musculoskeletal: Full passive range of motion without pain, normal range of motion and neck supple. No neck rigidity.     Comments: No rigidity, no meningismus Cardiovascular:     Rate and Rhythm: Normal rate and regular rhythm.     Heart sounds: Normal heart sounds. No murmur.  Pulmonary:     Effort: Pulmonary effort is normal. No respiratory distress.     Breath sounds: Normal breath sounds. No wheezing or rhonchi.  Abdominal:     General: Bowel sounds are normal.     Palpations: Abdomen is soft.     Tenderness: There is no abdominal tenderness. There is no guarding.  Musculoskeletal: Normal range of motion.  Skin:    General: Skin is warm and dry.     Findings: No rash.  Neurological:     Mental Status: He is alert and oriented to person, place, and time.     Cranial Nerves: No cranial nerve deficit.     Sensory: No sensory deficit.     Motor: No tremor or seizure activity.     Comments: AAOx3, answering questions and following commands appropriately; equal strength UE and LE bilaterally; CN grossly intact; moves all extremities appropriately without ataxia; no focal neuro deficits or facial asymmetry appreciated  Psychiatric:        Behavior: Behavior normal.        Thought Content: Thought content normal.      ED Treatments / Results  Labs (all labs ordered are listed, but only abnormal results are displayed) Labs Reviewed - No data to display  EKG None  Radiology No results found.   Procedures Procedures (including critical care time)  Medications Ordered in ED Medications  penicillin v potassium (VEETID) tablet 500 mg (has no administration in time range)  naproxen (NAPROSYN) tablet 500 mg (has no administration in time range)     Initial Impression / Assessment and Plan / ED Course  I have reviewed the triage vital signs and the nursing notes.  Pertinent labs & imaging results that were available during my care  of the patient were reviewed by me and considered in my medical decision making (see chart for details).  43 y.o. M here with dental pain ongoing for the past week, now causing headaches.  He is afebrile, non-toxic.  Neurologically intact without focal deficits.  He has multiple upper premolars and molars that are broken and decayed.  No discrete abscess noted.  No facial or neck swelling, handling secretions well. Normal phonation without stridor.  No signs/symptoms concerning for ludwig's angina.  Plan to d/c home with antibiotics and NSAIDs, given follow-up with dentist on call.  Return here for any new/acute changes.  Final Clinical Impressions(s) / ED Diagnoses   Final diagnoses:  Dental infection    ED Discharge Orders         Ordered    penicillin v potassium (VEETID) 500 MG tablet  4 times daily     06/21/19 2219    naproxen (NAPROSYN) 500 MG tablet  2 times daily with meals     06/21/19 2219           Garlon HatchetSanders, Unique Sillas M, PA-C 06/21/19 2222    Little, Ambrose Finlandachel Morgan, MD 06/22/19 [phone]

## 2019-06-21 NOTE — Discharge Instructions (Signed)
Take the prescribed medication as directed. Follow-up with dentist as soon as you can-- I would call in the morning to make appt with Dr. Haig Prophet who is on call for [place]. Return to the ED for new or worsening symptoms.

## 2019-07-24 ENCOUNTER — Emergency Department (HOSPITAL_COMMUNITY)
Admission: EM | Admit: 2019-07-24 | Discharge: 2019-07-25 | Disposition: A | Discharge status: Home / Self Care | Payer: Self-pay | Attending: Emergency Medicine | Admitting: Emergency Medicine

## 2019-07-24 ENCOUNTER — Other Ambulatory Visit: Payer: Self-pay

## 2019-07-24 ENCOUNTER — Encounter (HOSPITAL_COMMUNITY): Payer: Self-pay | Admitting: *Deleted

## 2019-07-24 DIAGNOSIS — F17210 Nicotine dependence, cigarettes, uncomplicated: Secondary | ICD-10-CM | POA: Insufficient documentation

## 2019-07-24 DIAGNOSIS — G44209 Tension-type headache, unspecified, not intractable: Secondary | ICD-10-CM | POA: Insufficient documentation

## 2019-07-24 DIAGNOSIS — Z79899 Other long term (current) drug therapy: Secondary | ICD-10-CM | POA: Insufficient documentation

## 2019-07-24 DIAGNOSIS — K029 Dental caries, unspecified: Secondary | ICD-10-CM | POA: Insufficient documentation

## 2019-07-24 NOTE — ED Triage Notes (Signed)
Pt was seen two weeks ago for dental pain, finished antibiotics but reports R sided headache with blurry vision that hasn't gone away. Has been taking 1000mg  tylenol without relief

## 2019-07-25 MED ORDER — KETOROLAC TROMETHAMINE 30 MG/ML IJ SOLN
30.0000 mg | Freq: Once | INTRAMUSCULAR | Status: AC
  Administered 2019-07-25: 30 mg via INTRAMUSCULAR
  Filled 2019-07-25: qty 1

## 2019-07-25 NOTE — ED Provider Notes (Signed)
[facility] EMERGENCY DEPARTMENT Provider Note  CSN: [id number] Arrival date & time: 07/24/19 2148  Chief Complaint(s) Headache and Dental Pain  HPI Benjamin Butler is a 43 y.o. male    Headache Pain location:  L temporal, R temporal and occipital Quality:  Dull Radiates to:  Does not radiate Onset quality:  Gradual Duration:  1 week Timing:  Intermittent Progression:  Waxing and waning Chronicity:  Recurrent Similar to prior headaches: yes   Relieved by:  NSAIDs Worsened by:  Neck movement Associated symptoms: no cough, no fatigue, no fever, no nausea, no neck stiffness and no photophobia   Dental Pain Associated symptoms: headaches   Associated symptoms: no fever    Seen previously for dental pain. Pain now intermittent. No current pain.  Past Medical History Past Medical History:  Diagnosis Date  . Bipolar 1 disorder ([place])   . Depression   . Medical history non-contributory    Patient Active Problem List   Diagnosis Date Noted  . Psychosis ([place]) 01/08/2014  . Cannabis abuse 12/21/2013  . Major depressive disorder, single episode, severe, specified as with psychotic behavior 12/20/2013  . Homicidal ideation 12/19/2013  . Intermittent explosive disorder 12/19/2013   Home Medication(s) Prior to Admission medications   Medication Sig Start Date End Date Taking? Authorizing Provider  cephALEXin (KEFLEX) 500 MG capsule Take 1 capsule (500 mg total) by mouth 3 (three) times daily. Patient not taking: Reported on 10/06/2014 05/16/14   Harvie Heck, PA-C  FLUoxetine (PROZAC) 40 MG capsule Take 1 capsule (40 mg total) by mouth daily. Patient not taking: Reported on 10/04/2014 01/12/14   Niel Hummer, NP  HYDROcodone-acetaminophen (NORCO/VICODIN) 5-325 MG per tablet Take 1-2 tablets by mouth every 4 (four) hours as needed. 10/06/14   Dorie Rank, MD  hydrOXYzine (ATARAX/VISTARIL) 25 MG tablet Take 1 tablet (25 mg total) by mouth every 6 (six) hours.  Patient not taking: Reported on 10/04/2014 05/16/14   Harvie Heck, PA-C  lidocaine (XYLOCAINE) 5 % ointment Apply 1 application topically 2 (two) times daily as needed. 10/05/14   Varney Biles, MD  naproxen (NAPROSYN) 500 MG tablet Take 1 tablet (500 mg total) by mouth 2 (two) times daily with a meal. 06/21/19   Larene Pickett, PA-C  penicillin v potassium (VEETID) 500 MG tablet Take 1 tablet (500 mg total) by mouth 4 (four) times daily. 06/21/19   Larene Pickett, PA-C  predniSONE (STERAPRED UNI-PAK) 10 MG tablet Take 6 tabs by mouth daily  for 2 days, then 5 tabs for 2 days, then 4 tabs for 2 days, then 3 tabs for 2 days, 2 tabs for 2 days, then 1 tab by mouth daily for 2 days 10/06/14   Dorie Rank, MD  QUEtiapine (SEROQUEL) 200 MG tablet Take 1 tablet (200 mg total) by mouth at bedtime. Patient not taking: Reported on 10/04/2014 01/12/14   Niel Hummer, NP  traMADol (ULTRAM) 50 MG tablet Take 1 tablet (50 mg total) by mouth every 6 (six) hours as needed. 05/29/17   Maczis, Barth Kirks, PA-C  traZODone (DESYREL) 50 MG tablet Take 1 tablet (50 mg total) by mouth at bedtime. 01/12/14   Niel Hummer, NP  Past Surgical History Past Surgical History:  Procedure Laterality Date  . ACNE CYST REMOVAL     back   Family History No family history on file.  Social History Social History   Tobacco Use  . Smoking status: Heavy Tobacco Smoker    Packs/day: 1.50    Types: Cigarettes  . Smokeless tobacco: Never Used  Substance Use Topics  . Alcohol use: No  . Drug use: Yes    Types: Marijuana   Allergies Patient has no known allergies.  Review of Systems Review of Systems  Constitutional: Negative for fatigue and fever.  Eyes: Negative for photophobia.  Respiratory: Negative for cough.   Gastrointestinal: Negative for nausea.  Musculoskeletal: Negative for  neck stiffness.  Neurological: Positive for headaches.   All other systems are reviewed and are negative for acute change except as noted in the HPI  Physical Exam Vital Signs  I have reviewed the triage vital signs BP 122/73 (BP Location: Right Arm)   Pulse 61   Temp 97.8 F (36.6 C) (Oral)   Resp 20   SpO2 99%   Physical Exam Vitals signs reviewed.  Constitutional:      General: He is not in acute distress.    Appearance: He is well-developed. He is not diaphoretic.  HENT:     Head: Normocephalic and atraumatic.     Jaw: No trismus.     Right Ear: External ear normal.     Left Ear: External ear normal.     Nose: Nose normal.     Right Sinus: No maxillary sinus tenderness or frontal sinus tenderness.     Left Sinus: No maxillary sinus tenderness or frontal sinus tenderness.     Mouth/Throat:     Dentition: Abnormal dentition. Dental caries present. No dental tenderness.  Eyes:     General: No scleral icterus.    Conjunctiva/sclera: Conjunctivae normal.  Neck:     Musculoskeletal: Normal range of motion.     Trachea: Phonation normal.  Cardiovascular:     Rate and Rhythm: Normal rate and regular rhythm.  Pulmonary:     Effort: Pulmonary effort is normal. No respiratory distress.     Breath sounds: No stridor.  Abdominal:     General: There is no distension.  Musculoskeletal: Normal range of motion.     Cervical back: He exhibits tenderness. He exhibits no bony tenderness.       Back:  Neurological:     Mental Status: He is alert and oriented to person, place, and time.  Psychiatric:        Behavior: Behavior normal.     ED Results and Treatments Labs (all labs ordered are listed, but only abnormal results are displayed) Labs Reviewed - No data to display                                                                                                                       EKG  EKG Interpretation  Date/Time:  Ventricular Rate:    PR Interval:    QRS  Duration:   QT Interval:    QTC Calculation:   R Axis:     Text Interpretation:        Radiology No results found.  Pertinent labs & imaging results that were available during my care of the patient were reviewed by me and considered in my medical decision making (see chart for details).  Medications Ordered in ED Medications  ketorolac (TORADOL) 30 MG/ML injection 30 mg (has no administration in time range)                                                                                                                                    Procedures Procedures  (including critical care time)  Medical Decision Making / ED Course I have reviewed the nursing notes for this encounter and the patient's prior records (if available in EHR or on provided paperwork).   [name] was evaluated in Emergency Department on 07/25/2019 for the symptoms described in the history of present illness. He was evaluated in the context of the global COVID-19 pandemic, which necessitated consideration that the patient might be at risk for infection with the SARS-CoV-2 virus that causes COVID-19. Institutional protocols and algorithms that pertain to the evaluation of patients at risk for COVID-19 are in a state of rapid change based on information released by regulatory bodies including the CDC and federal and state organizations. These policies and algorithms were followed during the patient's care in the ED.   Non focal neuro exam. No recent head trauma. No fever. Doubt meningitis. Doubt intracranial bleed. Doubt IIH. No indication for imaging.   Consistent with tension headache. toradol given.  Patient has dental caries, but evidence of infection.  The patient appears reasonably screened and/or stabilized for discharge and I doubt any other medical condition or other [facility] requiring further screening, evaluation, or treatment in the ED at this time prior to discharge.       Final Clinical  Impression(s) / ED Diagnoses Final diagnoses:  Tension headache  Dental caries    The patient appears reasonably screened and/or stabilized for discharge and I doubt any other medical condition or other [facility] requiring further screening, evaluation, or treatment in the ED at this time prior to discharge.  Disposition: Discharge  Condition: Good  I have discussed the results, Dx and Tx plan with the patient who expressed understanding and agree(s) with the plan. Discharge instructions discussed at great length. The patient was given strict return precautions who verbalized understanding of the instructions. No further questions at time of discharge.    ED Discharge Orders    None          This chart was dictated using voice recognition software.  Despite best efforts to proofread,  errors can occur which can change the documentation meaning.   Nira Connardama, Karsin Pesta Eduardo, MD 07/25/19 [phone]

## 2019-07-25 NOTE — Discharge Instructions (Addendum)
You may use over-the-counter Motrin (Ibuprofen), Acetaminophen (Tylenol), topical muscle creams such as SalonPas, Icy Hot, Bengay, etc. Please stretch, apply heat, and have massage therapy for additional assistance. ° °

## 2019-07-25 NOTE — ED Notes (Signed)
Patient verbalizes understanding of discharge instructions. Opportunity for questioning and answers were provided. Armband removed by staff, pt discharged from ED ambulatory.   

## 2020-03-27 ENCOUNTER — Other Ambulatory Visit: Payer: Self-pay

## 2020-03-27 ENCOUNTER — Encounter (HOSPITAL_COMMUNITY): Payer: Self-pay

## 2020-03-27 ENCOUNTER — Emergency Department (HOSPITAL_COMMUNITY)
Admission: EM | Admit: 2020-03-27 | Discharge: 2020-03-28 | Disposition: A | Discharge status: Home / Self Care | Payer: Self-pay | Attending: Emergency Medicine | Admitting: Emergency Medicine

## 2020-03-27 DIAGNOSIS — F10929 Alcohol use, unspecified with intoxication, unspecified: Secondary | ICD-10-CM | POA: Insufficient documentation

## 2020-03-27 DIAGNOSIS — K0889 Other specified disorders of teeth and supporting structures: Secondary | ICD-10-CM | POA: Insufficient documentation

## 2020-03-27 DIAGNOSIS — Z5321 Procedure and treatment not carried out due to patient leaving prior to being seen by health care provider: Secondary | ICD-10-CM | POA: Insufficient documentation

## 2020-03-27 NOTE — ED Triage Notes (Addendum)
Pt BIB [facility] for eval L sided dental pain x 1 week. States he was supposed to be taking amox for dental infection, ran out and has not refilled. Has not taken any OTC meds. Pt reports + ETOH

## 2020-03-28 NOTE — ED Notes (Signed)
Called Pt for vitals still no answer. 

## 2020-03-28 NOTE — ED Notes (Signed)
Called Pt for vitals no answer. 

## 2020-05-30 ENCOUNTER — Emergency Department (HOSPITAL_COMMUNITY): Payer: Self-pay

## 2020-05-30 ENCOUNTER — Emergency Department (HOSPITAL_COMMUNITY)
Admission: EM | Admit: 2020-05-30 | Discharge: 2020-05-30 | Disposition: A | Discharge status: Home / Self Care | Payer: Self-pay | Attending: Emergency Medicine | Admitting: Emergency Medicine

## 2020-05-30 ENCOUNTER — Encounter (HOSPITAL_COMMUNITY): Payer: Self-pay | Admitting: *Deleted

## 2020-05-30 DIAGNOSIS — F17210 Nicotine dependence, cigarettes, uncomplicated: Secondary | ICD-10-CM | POA: Insufficient documentation

## 2020-05-30 DIAGNOSIS — R0602 Shortness of breath: Secondary | ICD-10-CM | POA: Insufficient documentation

## 2020-05-30 DIAGNOSIS — Z79899 Other long term (current) drug therapy: Secondary | ICD-10-CM | POA: Insufficient documentation

## 2020-05-30 MED ORDER — ALBUTEROL SULFATE HFA 108 (90 BASE) MCG/ACT IN AERS
2.0000 | INHALATION_SPRAY | Freq: Once | RESPIRATORY_TRACT | Status: AC
  Administered 2020-05-30: 2 via RESPIRATORY_TRACT
  Filled 2020-05-30: qty 6.7

## 2020-05-30 NOTE — ED Provider Notes (Signed)
[facility] EMERGENCY DEPARTMENT Provider Note   CSN: [id number] Arrival date & time: 05/30/20  1017     History Chief Complaint  Patient presents with  . Shortness of Breath    Benjamin Butler is a 44 y.o. male.  The history is provided by the patient and medical records. No language interpreter was used.  Shortness of Breath    44 year old male with history of bipolar, depression, substance abuse, presenting complaining of shortness of breath.  Patient report for the past week he has noticed some intermittent shortness of breath.  He noticed shortness of breath when he lays down to rest but improves when he stands up or when he has a fan on his face.  Shortness of breath is mild to moderate in severity but no significant cough no fever chills no runny nose sneezing sore throat loss of taste or smell nausea vomiting or diarrhea.  He admits to tobacco use but states that he has quit smoking for the past few days and intends to quit smoking.  He does not have a primary care provider.  He is scheduled to receive a Covid vaccine today and would like to make sure that his lung is "okay".  He denies prior history of PE or DVT.  Denies any active chest pain.  Past Medical History:  Diagnosis Date  . Bipolar 1 disorder (HCC)   . Depression   . Medical history non-contributory     Patient Active Problem List   Diagnosis Date Noted  . Psychosis (HCC) 01/08/2014  . Cannabis abuse 12/21/2013  . Major depressive disorder, single episode, severe, specified as with psychotic behavior 12/20/2013  . Homicidal ideation 12/19/2013  . Intermittent explosive disorder 12/19/2013    Past Surgical History:  Procedure Laterality Date  . ACNE CYST REMOVAL     back       No family history on file.  Social History   Tobacco Use  . Smoking status: Heavy Tobacco Smoker    Packs/day: 1.50    Types: Cigarettes  . Smokeless tobacco: Never Used  Substance Use Topics  . Alcohol  use: No  . Drug use: Yes    Types: Marijuana    Home Medications Prior to Admission medications   Medication Sig Start Date End Date Taking? Authorizing Provider  cephALEXin (KEFLEX) 500 MG capsule Take 1 capsule (500 mg total) by mouth 3 (three) times daily. Patient not taking: Reported on 10/06/2014 05/16/14   Mellody Drown, PA-C  FLUoxetine (PROZAC) 40 MG capsule Take 1 capsule (40 mg total) by mouth daily. Patient not taking: Reported on 10/04/2014 01/12/14   Thermon Leyland, NP  HYDROcodone-acetaminophen (NORCO/VICODIN) 5-325 MG per tablet Take 1-2 tablets by mouth every 4 (four) hours as needed. 10/06/14   Linwood Dibbles, MD  hydrOXYzine (ATARAX/VISTARIL) 25 MG tablet Take 1 tablet (25 mg total) by mouth every 6 (six) hours. Patient not taking: Reported on 10/04/2014 05/16/14   Mellody Drown, PA-C  lidocaine (XYLOCAINE) 5 % ointment Apply 1 application topically 2 (two) times daily as needed. 10/05/14   Derwood Kaplan, MD  naproxen (NAPROSYN) 500 MG tablet Take 1 tablet (500 mg total) by mouth 2 (two) times daily with a meal. 06/21/19   Garlon Hatchet, PA-C  penicillin v potassium (VEETID) 500 MG tablet Take 1 tablet (500 mg total) by mouth 4 (four) times daily. 06/21/19   Garlon Hatchet, PA-C  predniSONE (STERAPRED UNI-PAK) 10 MG tablet Take 6 tabs by mouth daily  for 2 days, then 5 tabs for 2 days, then 4 tabs for 2 days, then 3 tabs for 2 days, 2 tabs for 2 days, then 1 tab by mouth daily for 2 days 10/06/14   Linwood Dibbles, MD  QUEtiapine (SEROQUEL) 200 MG tablet Take 1 tablet (200 mg total) by mouth at bedtime. Patient not taking: Reported on 10/04/2014 01/12/14   Thermon Leyland, NP  traMADol (ULTRAM) 50 MG tablet Take 1 tablet (50 mg total) by mouth every 6 (six) hours as needed. 05/29/17   Maczis, Elmer Sow, PA-C  traZODone (DESYREL) 50 MG tablet Take 1 tablet (50 mg total) by mouth at bedtime. 01/12/14   Thermon Leyland, NP    Allergies    Patient has no known allergies.  Review of  Systems   Review of Systems  Respiratory: Positive for shortness of breath.   All other systems reviewed and are negative.   Physical Exam Updated Vital Signs BP 121/74 (BP Location: Left Arm)   Pulse (!) 56   Temp 97.9 F (36.6 C) (Oral)   Resp 12   SpO2 99%   Physical Exam Vitals and nursing note reviewed.  Constitutional:      General: He is not in acute distress.    Appearance: He is well-developed.  HENT:     Head: Atraumatic.  Eyes:     Conjunctiva/sclera: Conjunctivae normal.  Cardiovascular:     Rate and Rhythm: Normal rate and regular rhythm.  Pulmonary:     Effort: Pulmonary effort is normal.     Breath sounds: Normal breath sounds. No decreased breath sounds, wheezing, rhonchi or rales.  Chest:     Chest wall: No tenderness.  Musculoskeletal:     Cervical back: Neck supple.     Right lower leg: No edema.     Left lower leg: No edema.  Skin:    Findings: No rash.  Neurological:     Mental Status: He is alert.  Psychiatric:        Mood and Affect: Mood normal.     ED Results / Procedures / Treatments   Labs (all labs ordered are listed, but only abnormal results are displayed) Labs Reviewed - No data to display  EKG None  Radiology DG Chest 2 View  Result Date: 05/30/2020 CLINICAL DATA:  Shortness of breath EXAM: CHEST - 2 VIEW COMPARISON:  May 29, 2017 FINDINGS: The cardiomediastinal silhouette is unchanged in contour. No pleural effusion. No pneumothorax. No acute pleuroparenchymal abnormality. Visualized abdomen is unremarkable. Mild degenerative changes of the thoracic spine. IMPRESSION: No acute cardiopulmonary abnormality. Electronically Signed   By: Meda Klinefelter MD   On: 05/30/2020 11:22    Procedures Procedures (including critical care time)  Medications Ordered in ED Medications  albuterol (VENTOLIN HFA) 108 (90 Base) MCG/ACT inhaler 2 puff (has no administration in time range)    ED Course  I have reviewed the triage vital  signs and the nursing notes.  Pertinent labs & imaging results that were available during my care of the patient were reviewed by me and considered in my medical decision making (see chart for details).    MDM Rules/Calculators/A&P                          BP 121/74 (BP Location: Left Arm)   Pulse (!) 56   Temp 97.9 F (36.6 C) (Oral)   Resp 12   SpO2 99%   Final Clinical  Impression(s) / ED Diagnoses Final diagnoses:  Shortness of breath    Rx / DC Orders ED Discharge Orders    None     4:00 PM Patient report positional shortness of breath worse when he lays down and improves when he walks around or when he has a fan on his face.  He is able to speak in complete sentences, no hypoxia, vital signs stable, chest x-ray today show no evidence of infection.  He is on his way to receive his Covid vaccination.  He does not have any chest pain therefore I have low suspicion for pericarditis.  He is a smoker therefore I encourage smoking cessation.  Will provide albuterol inhaler to use as needed.  Encourage patient to find a primary care provider for further evaluation and perhaps a sleep study down the road to rule out obstructive sleep apnea.  Return precaution discussed.   Fayrene Helper, PA-C 05/30/20 1613    Tegeler, Canary Brim, MD 05/30/20 [phone]

## 2020-05-30 NOTE — ED Notes (Signed)
Patient verbalizes understanding of discharge instructions. Opportunity for questioning and answers were provided. Armband removed by staff, pt discharged from ED ambulatory.   

## 2020-05-30 NOTE — ED Notes (Signed)
Pt waiting outside

## 2020-05-30 NOTE — Discharge Instructions (Signed)
Please call and follow-up with a primary care provider using number below have close monitoring and management of your health.  You may benefit from a sleep study as your symptoms may be related to obstructive sleep apnea.  Please avoid tobacco use.  Get Covid vaccination as scheduled.  Use albuterol inhaler 2 puffs every 4 hours as needed for shortness of breath.

## 2020-05-30 NOTE — ED Triage Notes (Signed)
To ED for feeling like he's more sob when laying down - noticed on Saturday. States he was a 'heavy smoker' - stopped 3 days ago. Had covid test Sunday and came back negative. No fevers. No cough. States he feels better when he has fans in his room to sleep. Appears in nad. Speaks in full complete sentences.

## 2020-06-06 ENCOUNTER — Other Ambulatory Visit: Payer: Self-pay

## 2020-06-06 ENCOUNTER — Emergency Department (HOSPITAL_COMMUNITY)
Admission: EM | Admit: 2020-06-06 | Discharge: 2020-06-07 | Disposition: A | Discharge status: Home / Self Care | Payer: Self-pay | Attending: Emergency Medicine | Admitting: Emergency Medicine

## 2020-06-06 ENCOUNTER — Emergency Department (HOSPITAL_COMMUNITY): Payer: Self-pay

## 2020-06-06 DIAGNOSIS — Z5321 Procedure and treatment not carried out due to patient leaving prior to being seen by health care provider: Secondary | ICD-10-CM | POA: Insufficient documentation

## 2020-06-06 NOTE — ED Triage Notes (Signed)
Pt c/o sob since living in a new house. Reports finding out there is mold in the house he lives in from water leaking off air conditioner. Reports breathing is better when not at home. Had a negative covid test on Saturday.

## 2020-06-07 NOTE — ED Notes (Addendum)
Pt stated that he is going home and will be back at 7:30 because of his waiting time. I have advise him that if I call his name for vital recheck and he does not answer then he will remove him off the floor. He did not respond to my statement and just walked out.

## 2020-06-07 NOTE — ED Notes (Signed)
Pt called for vs no response 

## 2020-06-13 ENCOUNTER — Other Ambulatory Visit: Payer: Self-pay

## 2020-06-13 ENCOUNTER — Emergency Department (HOSPITAL_COMMUNITY): Payer: Self-pay

## 2020-06-13 ENCOUNTER — Emergency Department (HOSPITAL_COMMUNITY)
Admission: EM | Admit: 2020-06-13 | Discharge: 2020-06-13 | Disposition: A | Discharge status: Home / Self Care | Payer: Self-pay | Attending: Emergency Medicine | Admitting: Emergency Medicine

## 2020-06-13 DIAGNOSIS — F17200 Nicotine dependence, unspecified, uncomplicated: Secondary | ICD-10-CM | POA: Insufficient documentation

## 2020-06-13 DIAGNOSIS — R0602 Shortness of breath: Secondary | ICD-10-CM | POA: Insufficient documentation

## 2020-06-13 DIAGNOSIS — F1290 Cannabis use, unspecified, uncomplicated: Secondary | ICD-10-CM | POA: Insufficient documentation

## 2020-06-13 LAB — CBC
HCT: 44.9 % (ref 39.0–52.0)
Hemoglobin: 15 g/dL (ref 13.0–17.0)
MCH: 30.9 pg (ref 26.0–34.0)
MCHC: 33.4 g/dL (ref 30.0–36.0)
MCV: 92.4 fL (ref 80.0–100.0)
Platelets: 281 10*3/uL (ref 150–400)
RBC: 4.86 MIL/uL (ref 4.22–5.81)
RDW: 12.2 % (ref 11.5–15.5)
WBC: 10.2 10*3/uL (ref 4.0–10.5)
nRBC: 0 % (ref 0.0–0.2)

## 2020-06-13 LAB — BASIC METABOLIC PANEL
Anion gap: 12 (ref 5–15)
BUN: 15 mg/dL (ref 6–20)
CO2: 23 mmol/L (ref 22–32)
Calcium: 9.8 mg/dL (ref 8.9–10.3)
Chloride: 102 mmol/L (ref 98–111)
Creatinine, Ser: 1.24 mg/dL (ref 0.61–1.24)
GFR calc Af Amer: >60 mL/min (ref >60)
GFR calc non Af Amer: >60 mL/min (ref >60)
Glucose, Bld: 94 mg/dL (ref 70–99)
Potassium: 4.2 mmol/L (ref 3.5–5.1)
Sodium: 137 mmol/L (ref 135–145)

## 2020-06-13 LAB — BRAIN NATRIURETIC PEPTIDE: B Natriuretic Peptide: 33.1 pg/mL (ref 0.0–100.0)

## 2020-06-13 NOTE — Discharge Instructions (Addendum)
Your work-up today was overall reassuring.  Please make sure you continue to use the albuterol inhaler as needed.  It is very important that you follow-up with [facility] for further evaluation of your shortness of breath.  Return to the ER if your symptoms worsen.

## 2020-06-13 NOTE — Progress Notes (Signed)
   06/13/20 1856  TOC ED Mini Assessment  TOC Time spent with patient (minutes): 30  PING Used in TOC Assessment No  Admission or Readmission Diverted Yes  Interventions which prevented an admission or readmission Other (must enter comment) (PCP resources provided)  What brought you to the Emergency Department?  SOB  Barriers to Discharge No Barriers Identified  Means of departure Not know  Patient states their goals for this hospitalization and ongoing recovery are: Feel better/breathe better  CMS Medicare.gov Compare Post Acute Care list provided to: Patient  Choice offered to / list presented to  Patient  CSW met with Pt at bedside. CSW provided Pt with resources(brochure)for [facility] and gave very specific instructions for contacting and making appointment. Pt is aware of location and has transportation, but was unsure of  method of making contact with [facility].  Pt expressed understanding of importance of utilizing PCP services and thanked CSW. 

## 2020-06-13 NOTE — ED Provider Notes (Signed)
[facility] EMERGENCY DEPARTMENT Provider Note   CSN: [id number] Arrival date & time: 06/13/20  1547     History Chief Complaint  Patient presents with  . Shortness of Breath    Benjamin Butler is a 44 y.o. male.  HPI 44 year old male history of bipolar disorder, depression, chronic smoking, cannabis use presents to the ER with complaints of shortness of breath.  Patient states that he has been having ongoing shortness of breath for several months.  Recently seen here in the ED 05/30/2020 with similar complaints.  His work-up was negative, was given albuterol and told to follow-up with PCP.  Patient states that he continues to have shortness of breath worse with lying down, and when he becomes more active.  He has only used albuterol once, which did provide him some relief and will have him to sleep at night.  However he was concerned that given the mold the albuterol would make his symptoms worse.  He states that he was living in a house with mold recently and became very concerned that he had mold in his lungs which was contributing to the shortness of breath.  He states that he did not follow-up with a PCP as he does not know how to do this.  He denies any swelling, recent travel, chest pain.  No fevers or chills.  No coughing, runny nose, headaches.  Not vaccinated for Covid.  States that there has been no increase in her shortness of breath over the last few weeks.  He presents to the ERre to make su that he does might have mold in his lungs.  He states that when he was last seen here he did quit smoking for a week, however he felt better and thought that his shortness of breath was secondary to the mold.  He states he has resumed smoking cigarettes due to this.  No prior history of PE or DVT.  No active chest pain.  No sore throat, loss of taste or smell, vomiting or diarrhea.    Past Medical History:  Diagnosis Date  . Bipolar 1 disorder (HCC)   . Depression   . Medical  history non-contributory     Patient Active Problem List   Diagnosis Date Noted  . Psychosis (HCC) 01/08/2014  . Cannabis abuse 12/21/2013  . Major depressive disorder, single episode, severe, specified as with psychotic behavior 12/20/2013  . Homicidal ideation 12/19/2013  . Intermittent explosive disorder 12/19/2013    Past Surgical History:  Procedure Laterality Date  . ACNE CYST REMOVAL     back       No family history on file.  Social History   Tobacco Use  . Smoking status: Heavy Tobacco Smoker    Packs/day: 1.50    Types: Cigarettes  . Smokeless tobacco: Never Used  Substance Use Topics  . Alcohol use: No  . Drug use: Yes    Types: Marijuana    Home Medications Prior to Admission medications   Medication Sig Start Date End Date Taking? Authorizing Provider  cephALEXin (KEFLEX) 500 MG capsule Take 1 capsule (500 mg total) by mouth 3 (three) times daily. Patient not taking: Reported on 10/06/2014 05/16/14   Mellody Drown, PA-C  FLUoxetine (PROZAC) 40 MG capsule Take 1 capsule (40 mg total) by mouth daily. Patient not taking: Reported on 10/04/2014 01/12/14   Thermon Leyland, NP  HYDROcodone-acetaminophen (NORCO/VICODIN) 5-325 MG per tablet Take 1-2 tablets by mouth every 4 (four) hours as needed. 10/06/14  Linwood DibblesKnapp, Jon, MD  hydrOXYzine (ATARAX/VISTARIL) 25 MG tablet Take 1 tablet (25 mg total) by mouth every 6 (six) hours. Patient not taking: Reported on 10/04/2014 05/16/14   Mellody DrownParker, Lauren, PA-C  lidocaine (XYLOCAINE) 5 % ointment Apply 1 application topically 2 (two) times daily as needed. 10/05/14   Derwood KaplanNanavati, Ankit, MD  naproxen (NAPROSYN) 500 MG tablet Take 1 tablet (500 mg total) by mouth 2 (two) times daily with a meal. 06/21/19   Garlon HatchetSanders, Lisa M, PA-C  penicillin v potassium (VEETID) 500 MG tablet Take 1 tablet (500 mg total) by mouth 4 (four) times daily. 06/21/19   Garlon HatchetSanders, Lisa M, PA-C  predniSONE (STERAPRED UNI-PAK) 10 MG tablet Take 6 tabs by mouth daily   for 2 days, then 5 tabs for 2 days, then 4 tabs for 2 days, then 3 tabs for 2 days, 2 tabs for 2 days, then 1 tab by mouth daily for 2 days 10/06/14   Linwood DibblesKnapp, Jon, MD  QUEtiapine (SEROQUEL) 200 MG tablet Take 1 tablet (200 mg total) by mouth at bedtime. Patient not taking: Reported on 10/04/2014 01/12/14   Thermon Leylandavis, Laura A, NP  traMADol (ULTRAM) 50 MG tablet Take 1 tablet (50 mg total) by mouth every 6 (six) hours as needed. 05/29/17   Maczis, Elmer SowMichael M, PA-C  traZODone (DESYREL) 50 MG tablet Take 1 tablet (50 mg total) by mouth at bedtime. 01/12/14   Thermon Leylandavis, Laura A, NP    Allergies    Patient has no known allergies.  Review of Systems   Review of Systems  Constitutional: Negative for chills and fever.  HENT: Negative for ear pain and sore throat.   Eyes: Negative for pain and visual disturbance.  Respiratory: Positive for shortness of breath. Negative for cough.   Cardiovascular: Negative for chest pain and palpitations.  Gastrointestinal: Negative for abdominal pain and vomiting.  Genitourinary: Negative for dysuria and hematuria.  Musculoskeletal: Negative for arthralgias and back pain.  Skin: Negative for color change and rash.  Neurological: Negative for seizures, syncope and headaches.  All other systems reviewed and are negative.   Physical Exam Updated Vital Signs BP 102/77 (BP Location: Right Arm)   Pulse 98   Temp 98.6 F (37 C) (Oral)   Resp 18   Ht 6\' 1"  (1.854 m)   Wt 88.5 kg   SpO2 97%   BMI 25.73 kg/m   Physical Exam Vitals and nursing note reviewed.  Constitutional:      General: He is not in acute distress.    Appearance: He is well-developed and normal weight. He is not ill-appearing or diaphoretic.     Interventions: He is not intubated.    Comments: Well-appearing, speaking full sentences, no acute respiratory distress.  HENT:     Head: Normocephalic and atraumatic.  Eyes:     Conjunctiva/sclera: Conjunctivae normal.  Cardiovascular:     Rate and Rhythm:  Normal rate and regular rhythm.     Heart sounds: No murmur heard.   Pulmonary:     Effort: Pulmonary effort is normal. No tachypnea, bradypnea, accessory muscle usage or respiratory distress. He is not intubated.     Breath sounds: Normal breath sounds.  Abdominal:     Palpations: Abdomen is soft.     Tenderness: There is no abdominal tenderness.  Musculoskeletal:        General: Normal range of motion.     Cervical back: Neck supple.  Skin:    General: Skin is warm and dry.  Findings: No ecchymosis or erythema.  Neurological:     General: No focal deficit present.     Mental Status: He is alert.  Psychiatric:        Mood and Affect: Mood normal.        Behavior: Behavior normal.     ED Results / Procedures / Treatments   Labs (all labs ordered are listed, but only abnormal results are displayed) Labs Reviewed  CBC  BASIC METABOLIC PANEL    EKG EKG Interpretation  Date/Time:  Thursday June 13 2020 15:54:01 EDT Ventricular Rate:  94 PR Interval:  128 QRS Duration: 90 QT Interval:  346 QTC Calculation: 432 R Axis:   -43 Text Interpretation: Normal sinus rhythm Right atrial enlargement Left axis deviation Pulmonary disease pattern Abnormal ECG Poor data quality in current ECG precludes serial comparison Confirmed by Pricilla Loveless [phone]) on 06/13/2020 5:20:07 PM   Radiology DG Chest 2 View  Result Date: 06/13/2020 CLINICAL DATA:  Shortness of breath for 1 week, tobacco abuse EXAM: CHEST - 2 VIEW COMPARISON:  06/06/2020 FINDINGS: Frontal and lateral views of the chest demonstrate a stable cardiac silhouette. No acute airspace disease, effusion, or pneumothorax. No acute bony abnormalities. IMPRESSION: 1. Stable exam, no acute process. Electronically Signed   By: Sharlet Salina M.D.   On: 06/13/2020 17:02    Procedures Procedures (including critical care time)  Medications Ordered in ED Medications - No data to display  ED Course  I have reviewed the triage  vital signs and the nursing notes.  Pertinent labs & imaging results that were available during my care of the patient were reviewed by me and considered in my medical decision making (see chart for details).    MDM Rules/Calculators/A&P                         44 year old male with complaints of ongoing shortness of breath over the last few months, with recent realization that he lived in a house with mold and with concerns that he has mold in his lungs On presentation, he is alert, oriented, nontoxic-appearing, no acute distress, no evidence of increased work of breathing, speaking in full sentences.  Vitals overall reassuring, is not tachycardic, tachypneic or hypoxic.  Physical exam with clear lung sounds, possibly mildly decreased but no rales or rhonchi.  No accessory muscle usage.  No chest wall tenderness.  Abdomen soft and nontender.  Personally reviewed and interpreted his lab work BMP with no electrolyte abnormalities, normal renal function. CBC without leukocytosis. BNP normal Chest x-ray without any evidence of pulmonary infiltrate filtrates, no evidence of pneumonia, pneumothorax EKG normal sinus rhythm but with possible pulmonary disease pattern  MDM: Patient with no evidence of respiratory distress, no evidence of infection, pneumothorax, pericardial effusion on chest x-ray.  Concern for PE is low as he has no risk factors, is not tachycardic, hypoxic or tachypneic.  No leg swelling noted. No CP, doubt pericarditis. No leg swelling, normal BNP, doubt CHF. Symptoms have been going on for several months.  He would likely benefit from further PFTs and outpatient follow-up.  Patient was encouraged to continue to use albuterol as needed.  Patient was provided with contact information to [facility], and I also reached out to the transition of care team to help facilitate this.    Disposition: Patient stable for discharge with encouragement for smoking cessation,  continuous use of albuterol as needed, follow-up with [facility].   Final Clinical Impression(s) / ED Diagnoses Final diagnoses:  Shortness of breath    Rx / DC Orders ED Discharge Orders    None       Leone Brand 06/13/20 Jodelle Green, MD 06/14/20 [phone]

## 2020-06-13 NOTE — ED Notes (Signed)
Patient verbalizes understanding of discharge instructions. Opportunity for questioning and answers were provided. Armband removed by staff, pt discharged from ED stable & ambulatory  

## 2020-06-13 NOTE — ED Triage Notes (Signed)
Pt reports shob since living in a house that has mold in it. Reports it is better now that he has moved out of that house, but still feels like he's in a box trying to breathe through it. Seen for same, negative covid test.

## 2020-07-09 ENCOUNTER — Ambulatory Visit: Payer: Self-pay | Attending: Family | Admitting: Family

## 2020-07-09 ENCOUNTER — Encounter: Payer: Self-pay | Admitting: Family

## 2020-07-09 ENCOUNTER — Other Ambulatory Visit: Payer: Self-pay

## 2020-07-09 VITALS — BP 121/79 | HR 72 | Temp 98.4°F | Resp 16 | Ht 73.0 in | Wt 209.0 lb

## 2020-07-09 DIAGNOSIS — Z09 Encounter for follow-up examination after completed treatment for conditions other than malignant neoplasm: Secondary | ICD-10-CM

## 2020-07-09 DIAGNOSIS — R0602 Shortness of breath: Secondary | ICD-10-CM

## 2020-07-09 NOTE — Patient Instructions (Signed)
Continue Albuterol as needed for shortness of breath. Referral for PFT's. Follow-up as needed with primary physician. Shortness of Breath, Adult Shortness of breath means you have trouble breathing. Shortness of breath could be a sign of a medical problem. Follow these instructions at home:   Watch for any changes in your symptoms.  Do not use any products that contain nicotine or tobacco, such as cigarettes, e-cigarettes, and chewing tobacco.  Do not smoke. Smoking can cause shortness of breath. If you need help to quit smoking, ask your doctor.  Avoid things that can make it harder to breathe, such as: ? Mold. ? Dust. ? Air pollution. ? Chemical smells. ? Things that can cause allergy symptoms (allergens), if you have allergies.  Keep your living space clean. Use products that help remove mold and dust.  Rest as needed. Slowly return to your normal activities.  Take over-the-counter and prescription medicines only as told by your doctor. This includes oxygen therapy and inhaled medicines.  Keep all follow-up visits as told by your doctor. This is important. Contact a doctor if:  Your condition does not get better as soon as expected.  You have a hard time doing your normal activities, even after you rest.  You have new symptoms. Get help right away if:  Your shortness of breath gets worse.  You have trouble breathing when you are resting.  You feel light-headed or you pass out (faint).  You have a cough that is not helped by medicines.  You cough up blood.  You have pain with breathing.  You have pain in your chest, arms, shoulders, or belly (abdomen).  You have a fever.  You cannot walk up stairs.  You cannot exercise the way you normally do. These symptoms may represent a serious problem that is an emergency. Do not wait to see if the symptoms will go away. Get medical help right away. Call your local emergency services (911 in the [place] Do not drive  yourself to the hospital. Summary  Shortness of breath is when you have trouble breathing enough air. It can be a sign of a medical problem.  Avoid things that make it hard for you to breathe, such as smoking, pollution, mold, and dust.  Watch for any changes in your symptoms. Contact your doctor if you do not get better or you get worse. This information is not intended to replace advice given to you by your health care provider. Make sure you discuss any questions you have with your health care provider. Document Revised: 03/14/2018 Document Reviewed: 03/14/2018 Elsevier Patient Education  2020 [employer].

## 2020-07-09 NOTE — Progress Notes (Signed)
Patient ID: Benjamin Butler, male    DOB: 1976/01/16  MRN: [id number]  CC: Hospital Follow-Up  Subjective: Benjamin Butler is a 44 y.o. male with history of major depressive disorder, bipolar, psychosis, chronic smoking, and cannabis use who presents for hospital follow-up.  1. ER FOLLOW UP: 06/13/2020: Patient visit at the [facility] emergency department with Dr. Criss Alvine. Patient presented to hospital with shortness of breath. Reports shortness of breath occurring for several months. Recently seen in ED on 05/30/2020 with similar complaint. His work-up was negative, given Albuterol and told to follow-up with PCP. Reports continues to have shortness of breath and worse when lying down and when he is more active. Only used Albuterol once which gave some relief so that he could sleep at night. Patient had concern about mold and Albuterol making his symptoms worse. Patient reported he was living in a house with mold recently and became very concerned that he had mold in his lungs which was contributing to shortness of breath. Reports he did not follow-up with PCP because he was not aware how to do this. Denied swelling, recent travel, chest pain, fever, chills, coughing, runny nose, and headaches. Not vaccinated for Covid. Reported no increase in shortness of breath over the last few weeks. Presented to the ER to make sure he does not have mold in his lungs. Reports he did quit smoking for a week and felt better and thought his shortness of breath was secondary to the mold. Reports he began to smoke cigarettes again because of this reason. No history of PE or DVT. No active chest pain. No sore throat, loss of taste or smell, vomiting, or diarrhea.  07/09/2020: Time since discharge: 26 days Hospital/facility: [facility] emergency department Diagnosis: shortness of breath Procedures/tests: EKG, CBC, BMP, BNP, DG chest 2 view Consultants: none New medications: none Discharge instructions:  Symptoms going on for several months and would likely benefit from further PFTs and outpatient follow-up. Patient encouraged to use Albuterol as needed. Patient stable for discharge with encouragement for smoking cessation, continuous use of Albuterol as needed, and follow-up with PCP.  Status: better Reports smoking at least 0.5 packs of cigarettes daily. Taking Albuterol at least twice weekly and the last time being 2 days ago. Reports since hospital discharge he has moved out of the home he believes has mold and currently living with his father. Reports still having shortness of breath sometimes when laying down and usually if it is hot outside. 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 Denies shortness of breath, chest pain, and chest pressure during today's visit.  Patient Active Problem List   Diagnosis Date Noted  . Psychosis (HCC) 01/08/2014  . Cannabis abuse 12/21/2013  . Major depressive disorder, single episode, severe, specified as with psychotic behavior 12/20/2013  . Homicidal ideation 12/19/2013  . Intermittent explosive disorder 12/19/2013     Current Outpatient Medications on File Prior to Visit  Medication Sig Dispense Refill  . cephALEXin (KEFLEX) 500 MG capsule Take 1 capsule (500 mg total) by mouth 3 (three) times daily. (Patient not taking: Reported on 10/06/2014) 28 capsule 0  . FLUoxetine (PROZAC) 40 MG capsule Take 1 capsule (40 mg total) by mouth daily. (Patient not taking: Reported on 10/04/2014) 30 capsule 0  . HYDROcodone-acetaminophen (NORCO/VICODIN) 5-325 MG per tablet Take 1-2 tablets by mouth every 4 (four) hours as needed. 30 tablet 0  . hydrOXYzine (ATARAX/VISTARIL) 25 MG tablet Take  1 tablet (25 mg total) by mouth every 6 (six) hours. (Patient not taking: Reported on 10/04/2014) 12 tablet 0  . lidocaine (XYLOCAINE) 5 % ointment Apply 1 application topically 2 (two) times daily as needed. 35.44 g 0  . naproxen (NAPROSYN)  500 MG tablet Take 1 tablet (500 mg total) by mouth 2 (two) times daily with a meal. 30 tablet 0  . penicillin v potassium (VEETID) 500 MG tablet Take 1 tablet (500 mg total) by mouth 4 (four) times daily. 40 tablet 0  . predniSONE (STERAPRED UNI-PAK) 10 MG tablet Take 6 tabs by mouth daily  for 2 days, then 5 tabs for 2 days, then 4 tabs for 2 days, then 3 tabs for 2 days, 2 tabs for 2 days, then 1 tab by mouth daily for 2 days 42 tablet 0  . QUEtiapine (SEROQUEL) 200 MG tablet Take 1 tablet (200 mg total) by mouth at bedtime. (Patient not taking: Reported on 10/04/2014) 30 tablet 0  . traMADol (ULTRAM) 50 MG tablet Take 1 tablet (50 mg total) by mouth every 6 (six) hours as needed. 10 tablet 0  . traZODone (DESYREL) 50 MG tablet Take 1 tablet (50 mg total) by mouth at bedtime. 30 tablet 0   No current facility-administered medications on file prior to visit.    No Known Allergies  Social History   Socioeconomic History  . Marital status: Single    Spouse name: Not on file  . Number of children: Not on file  . Years of education: Not on file  . Highest education level: Not on file  Occupational History  . Not on file  Tobacco Use  . Smoking status: Heavy Tobacco Smoker    Packs/day: 1.50    Types: Cigarettes  . Smokeless tobacco: Never Used  Substance and Sexual Activity  . Alcohol use: No  . Drug use: Yes    Types: Marijuana  . Sexual activity: Not Currently  Other Topics Concern  . Not on file  Social History Narrative  . Not on file   Social Determinants of Health   Financial Resource Strain:   . Difficulty of Paying Living Expenses: Not on file  Food Insecurity:   . Worried About Programme researcher, broadcasting/film/video in the Last Year: Not on file  . Ran Out of Food in the Last Year: Not on file  Transportation Needs:   . Lack of Transportation (Medical): Not on file  . Lack of Transportation (Non-Medical): Not on file  Physical Activity:   . Days of Exercise per Week: Not on file    . Minutes of Exercise per Session: Not on file  Stress:   . Feeling of Stress : Not on file  Social Connections:   . Frequency of Communication with Friends and Family: Not on file  . Frequency of Social Gatherings with Friends and Family: Not on file  . Attends Religious Services: Not on file  . Active Member of Clubs or Organizations: Not on file  . Attends Banker Meetings: Not on file  . Marital Status: Not on file  Intimate Partner Violence:   . Fear of Current or Ex-Partner: Not on file  . Emotionally Abused: Not on file  . Physically Abused: Not on file  . Sexually Abused: Not on file    No family history on file.  Past Surgical History:  Procedure Laterality Date  . ACNE CYST REMOVAL     back    ROS: Review of Systems  Negative except as stated above  PHYSICAL EXAM: Vitals with BMI 07/09/2020 06/13/2020 06/13/2020  Height 6\' 1"  - -  Weight 209 lbs - -  BMI 27.58 - -  Systolic 121 138  Diastolic 79 84 75  Pulse 72 68 72  Some encounter information is confidential and restricted. Go to Review Flowsheets activity to see all data.    Wt Readings from Last 3 Encounters:  07/09/20 209 lb (94.8 kg)  06/13/20 195 lb (88.5 kg)  03/27/20 211 lb 10.3 oz (96 kg)   Physical Exam General appearance - alert, well appearing, and in no distress and oriented to person, place, and time Mental status - alert, oriented to person, place, and time, normal mood, behavior, speech, dress, motor activity, and thought processes Neck - supple, no significant adenopathy Lymphatics - no palpable lymphadenopathy, no hepatosplenomegaly Chest - clear to auscultation, no wheezes, rales or rhonchi, symmetric air entry, no tachypnea, retractions or cyanosis Heart - normal rate, regular rhythm, normal S1, S2, no murmurs, rubs, clicks or gallops Neurological - alert, oriented, normal speech, no focal findings or movement disorder noted  ASSESSMENT AND PLAN: 1. Hospital  discharge follow-up: 2. Shortness of breath: - Patient visit at the [facility] emergency department on 06/13/2020. Patient presented to hospital with shortness of breath. Reports shortness of breath occurring for several months. Recently seen in ED on 05/30/2020 with similar complaint. His work-up was negative, given Albuterol and told to follow-up with PCP. Reported continues to have shortness of breath and worse when lying down and when he is more active. Only used Albuterol once which gave some relief so that he could sleep at night. Patient had concern about mold and Albuterol making his symptoms worse. Patient reported he was living in a house with mold recently and became very concerned that he had mold in his lungs which was contributing to shortness of breath. Reported he did not follow-up with PCP because he was not aware how to do this. Denied swelling, recent travel, chest pain, fever, chills, coughing, runny nose, and headaches. Not vaccinated for Covid. Reported no increase in shortness of breath over the last few weeks. Presented to the ER to make sure he does not have mold in his lungs. Reported he did quit smoking for a week and felt better and thought his shortness of breath was secondary to the mold. Reported he began to smoke cigarettes again because of this reason. No history of PE or DVT. No active chest pain. No sore throat, loss of taste or smell, vomiting, or diarrhea. - Today reports feeling better. Reports smoking at least 0.5 packs of cigarettes daily which is improved from 1 pack dail. Taking Albuterol at least twice weekly and the last time being 2 days ago. Reports since hospital discharge he has moved out of the home he believes has mold and currently living with his father. Reports still having shortness of breath sometimes when laying down and usually if it is hot outside. 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 Denies shortness of  breath, chest pain, and chest pressure during today's visit. - Continue Albuterol as needed. - Referral for pulmonary function test for further evaluation. - Follow-up with primary physician as needed and to establish care. - Patient was given clear instructions to go to Emergency Department or return to medical center if symptoms don't improve, worsen, or new problems develop.The patient verbalized understanding. - Pulmonary function test; Future  Patient was given the opportunity to ask questions.  Patient verbalized understanding of the plan and was able to repeat key elements of the plan. Patient was given clear instructions to go to Emergency Department or return to medical center if symptoms don't improve, worsen, or new problems develop.The patient verbalized understanding.   Rema FendtAmy J Rishav Rockefeller, NP

## 2020-07-12 ENCOUNTER — Ambulatory Visit: Payer: Self-pay | Admitting: Allergy

## 2021-05-11 ENCOUNTER — Other Ambulatory Visit: Payer: Self-pay

## 2021-05-11 ENCOUNTER — Emergency Department (HOSPITAL_COMMUNITY)
Admission: EM | Admit: 2021-05-11 | Discharge: 2021-05-11 | Disposition: A | Discharge status: Home / Self Care | Payer: Self-pay | Attending: Emergency Medicine | Admitting: Emergency Medicine

## 2021-05-11 ENCOUNTER — Encounter (HOSPITAL_COMMUNITY): Payer: Self-pay | Admitting: Emergency Medicine

## 2021-05-11 DIAGNOSIS — S90561A Insect bite (nonvenomous), right ankle, initial encounter: Secondary | ICD-10-CM | POA: Insufficient documentation

## 2021-05-11 DIAGNOSIS — S90562A Insect bite (nonvenomous), left ankle, initial encounter: Secondary | ICD-10-CM | POA: Insufficient documentation

## 2021-05-11 DIAGNOSIS — W57XXXA Bitten or stung by nonvenomous insect and other nonvenomous arthropods, initial encounter: Secondary | ICD-10-CM | POA: Insufficient documentation

## 2021-05-11 DIAGNOSIS — F17210 Nicotine dependence, cigarettes, uncomplicated: Secondary | ICD-10-CM | POA: Insufficient documentation

## 2021-05-11 MED ORDER — HYDROCORTISONE 2.5 % EX LOTN: TOPICAL_LOTION | Freq: Two times a day (BID) | CUTANEOUS | 0 refills | Status: AC

## 2021-05-11 NOTE — ED Triage Notes (Signed)
Pt from home, states he thinks he was bitten by two ticks today while weed eating.

## 2021-05-11 NOTE — Discharge Instructions (Addendum)
You were evaluated in the ER for your insect bites.  We did not find any immediately concerning findings on our evaluation.  You should avoid using alcohol over the bites.  You can use Benadryl when you are not about to drive or operate heavy machinery, as well as your home hydrocortisone cream over the bites to minimize the itching.  I attached a number above for our community clinic, who you should call for follow-up in the next couple days for reevaluation and further management.  Please return to the ER for any worsening, including fevers, chills, sweats, nausea, vomiting, new rashes.

## 2021-05-11 NOTE — ED Provider Notes (Signed)
[facility] EMERGENCY DEPARTMENT Provider Note   CSN: [id number] Arrival date & time: 05/11/21  1717     History Chief Complaint  Patient presents with   Insect Bite    Benjamin Butler is a 45 y.o. male.  HPI  This is a 45 year old male PMHx bipolar, major depressive disorder with psychotic behavior, presenting for insect bites to bilateral lower extremities x3 days.  Symptoms are located over dorsal left foot, and proximal right ankle, mild severity, pruritic quality, no obvious improving or worsening factors.  He would EtOH to the bites and distal lower extremities which is also caused some itching and skin irritation.  He states that he works in [employer] using a [employer], and believes that some bugs got into his shoes.  He do not visualize any ticks, no other concerning exposures.  No further medical concern at this time including fevers, chills, sweats, sore throat, rhinorrhea, cough, sneeze, shortness of breath, chest pain, leg swelling, injury, nausea/vomiting, abdominal pain, focal paresthesias or weakness.  Obtained from patient and chart review.     Past Medical History:  Diagnosis Date   Bipolar 1 disorder [facility])    Depression    Medical history non-contributory     Patient Active Problem List   Diagnosis Date Noted   Psychosis (HCC) 01/08/2014   Cannabis abuse 12/21/2013   Major depressive disorder, single episode, severe, specified as with psychotic behavior 12/20/2013   Homicidal ideation 12/19/2013   Intermittent explosive disorder 12/19/2013    Past Surgical History:  Procedure Laterality Date   ACNE CYST REMOVAL     back       No family history on file.  Social History   Tobacco Use   Smoking status: Heavy Smoker    Packs/day: 1.50    Types: Cigarettes   Smokeless tobacco: Never  Substance Use Topics   Alcohol use: No   Drug use: Yes    Types: Marijuana    Home Medications Prior to Admission medications   Medication  Sig Start Date End Date Taking? Authorizing Provider  hydrocortisone 2.5 % lotion Apply topically 2 (two) times daily. 05/11/21  Yes Charlynne Pander, MD  albuterol (VENTOLIN HFA) 108 (90 Base) MCG/ACT inhaler Inhale into the lungs every 6 (six) hours as needed for wheezing or shortness of breath.    [provider]  cephALEXin (KEFLEX) 500 MG capsule Take 1 capsule (500 mg total) by mouth 3 (three) times daily. Patient not taking: Reported on 10/06/2014 05/16/14   Mellody Drown, PA-C  FLUoxetine (PROZAC) 40 MG capsule Take 1 capsule (40 mg total) by mouth daily. Patient not taking: Reported on 10/04/2014 01/12/14   Thermon Leyland, NP  HYDROcodone-acetaminophen (NORCO/VICODIN) 5-325 MG per tablet Take 1-2 tablets by mouth every 4 (four) hours as needed. Patient not taking: Reported on 07/09/2020 10/06/14   Linwood Dibbles, MD  hydrOXYzine (ATARAX/VISTARIL) 25 MG tablet Take 1 tablet (25 mg total) by mouth every 6 (six) hours. Patient not taking: Reported on 10/04/2014 05/16/14   Mellody Drown, PA-C  lidocaine (XYLOCAINE) 5 % ointment Apply 1 application topically 2 (two) times daily as needed. Patient not taking: Reported on 07/09/2020 10/05/14   Derwood Kaplan, MD  naproxen (NAPROSYN) 500 MG tablet Take 1 tablet (500 mg total) by mouth 2 (two) times daily with a meal. Patient not taking: Reported on 07/09/2020 06/21/19   Garlon Hatchet, PA-C  penicillin v potassium (VEETID) 500 MG tablet Take 1 tablet (500 mg  total) by mouth 4 (four) times daily. Patient not taking: Reported on 07/09/2020 06/21/19   Garlon Hatchet, PA-C  predniSONE (STERAPRED UNI-PAK) 10 MG tablet Take 6 tabs by mouth daily  for 2 days, then 5 tabs for 2 days, then 4 tabs for 2 days, then 3 tabs for 2 days, 2 tabs for 2 days, then 1 tab by mouth daily for 2 days Patient not taking: Reported on 07/09/2020 10/06/14   Linwood Dibbles, MD  QUEtiapine (SEROQUEL) 200 MG tablet Take 1 tablet (200 mg total) by mouth at bedtime. Patient not  taking: Reported on 10/04/2014 01/12/14   Thermon Leyland, NP  traMADol (ULTRAM) 50 MG tablet Take 1 tablet (50 mg total) by mouth every 6 (six) hours as needed. Patient not taking: Reported on 07/09/2020 05/29/17   Maczis, Elmer Sow, PA-C  traZODone (DESYREL) 50 MG tablet Take 1 tablet (50 mg total) by mouth at bedtime. Patient not taking: Reported on 07/09/2020 01/12/14   Thermon Leyland, NP    Allergies    Patient has no known allergies.  Review of Systems   Review of Systems  All other systems reviewed and are negative.  Physical Exam Updated Vital Signs BP 112/75 (BP Location: Right Arm)   Pulse 85   Temp 98.5 F (36.9 C) (Oral)   Resp 16   SpO2 100%   Physical Exam Vitals and nursing note reviewed.  Constitutional:      General: He is not in acute distress.    Appearance: He is well-developed.  HENT:     Head: Normocephalic and atraumatic.  Eyes:     Conjunctiva/sclera: Conjunctivae normal.  Cardiovascular:     Rate and Rhythm: Normal rate and regular rhythm.     Heart sounds: No murmur heard. Pulmonary:     Effort: Pulmonary effort is normal. No respiratory distress.     Breath sounds: Normal breath sounds.  Abdominal:     Palpations: Abdomen is soft.     Tenderness: There is no abdominal tenderness.  Musculoskeletal:     Cervical back: Normal range of motion and neck supple.     Right lower leg: No edema.     Left lower leg: No edema.  Skin:    General: Skin is warm and dry.     Comments: X1 small papule to left dorsal foot and x2 to right lateral malleolus, pruritic, consistent with insect bites.  No surrounding erythema, warmth, induration, fluctuance.  Extremities neurovascularly intact distally.  Neurological:     Mental Status: He is alert and oriented to person, place, and time. Mental status is at baseline.  Psychiatric:        Mood and Affect: Mood normal.        Behavior: Behavior normal.    ED Results / Procedures / Treatments   Labs (all labs  ordered are listed, but only abnormal results are displayed) Labs Reviewed - No data to display  EKG None  Radiology No results found.  Procedures Procedures   Medications Ordered in ED Medications - No data to display  ED Course  I have reviewed the triage vital signs and the nursing notes.  Pertinent labs & imaging results that were available during my care of the patient were reviewed by me and considered in my medical decision making (see chart for details).    MDM Rules/Calculators/A&P  This is a 45 year old male with no relevant PMHx presenting for insect bites to bilateral lower extremities.  Wounds not infected appearing on exam, afebrile, vital signs stable.  DDx included: Insect bite, tickborne illness, scabies, bedbugs, cellulitis, abscess, osteomyelitis  Presentation appears most consistent with isolated insect bites.  Unlikely tickborne illness, no systemic signs/symptoms, bites occurred over the last 3 days with no ticks visualized by regions, nor swollen ticks.  Unlikely scabies or bedbugs, no patterning of bites, and coinhabitants without similar symptoms.  No induration, fluctuance, erythema to suggest abscess, cellulitis, or osteomyelitis.  Therefore, feel the patient is stable for discharge home with close outpatient follow-up.  I provided him with contact information for the medication.  Strict return precautions provided, including systemic symptoms of infection in which case he was instructed to promptly return to the ER to initiate antibiotics.  Do not feel that he requires antibiotics at this time given the mild presentation today.  He does have hydrocortisone cream at home, which I encouraged him to apply for the itching, as well as p.o. Benadryl at nighttime.  He understands and agrees with this plan.  Patient HDS, nontoxic, ambulatory on reevaluation, subsequently discharged home.  Final Clinical Impression(s) / ED Diagnoses Final  diagnoses:  Insect bite, unspecified site, initial encounter    Rx / DC Orders ED Discharge Orders          Ordered    hydrocortisone 2.5 % lotion  2 times daily        05/11/21 1808             Colvin Caroli, MD 05/12/21 0353    Charlynne Pander, MD 05/14/21 [phone]

## 2021-05-26 ENCOUNTER — Emergency Department (HOSPITAL_COMMUNITY): Payer: Self-pay

## 2021-05-26 ENCOUNTER — Emergency Department (HOSPITAL_COMMUNITY)
Admission: EM | Admit: 2021-05-26 | Discharge: 2021-05-27 | Disposition: A | Discharge status: Home / Self Care | Payer: Self-pay | Attending: Emergency Medicine | Admitting: Emergency Medicine

## 2021-05-26 DIAGNOSIS — R5383 Other fatigue: Secondary | ICD-10-CM | POA: Insufficient documentation

## 2021-05-26 DIAGNOSIS — R5381 Other malaise: Secondary | ICD-10-CM | POA: Insufficient documentation

## 2021-05-26 DIAGNOSIS — F17210 Nicotine dependence, cigarettes, uncomplicated: Secondary | ICD-10-CM | POA: Insufficient documentation

## 2021-05-26 NOTE — ED Triage Notes (Signed)
Pt c/o generalized weakness, abd pain after tick bite approx 2wks ago. Pt state he's felt febrile, unable to check temperature at home. NAD in triage

## 2021-05-26 NOTE — ED Provider Notes (Signed)
Emergency Medicine Provider Triage Evaluation Note  Benjamin Butler , a 45 y.o. male  was evaluated in triage.  Pt complains of generally feeling unwell for the last several days.  Reports he has had generalized weakness, some abdominal pain and subjective fevers.  Reports he was potentially bitten by a tick several weeks ago and was evaluated for this.  At that time exam did not appear consistent with tick bite and patient was discharged with hydrocortisone lotion.  He was not given doxycycline.  Denies rashes, swelling or purulent drainage to any of the sites.  Review of Systems  Positive: Generalized weakness Negative: Nausea, vomiting, diarrhea, syncope  Physical Exam  BP 107/68 (BP Location: Left Arm)   Pulse 66   Temp 98.7 F (37.1 C)   Resp 16   SpO2 97%  Gen:   Awake, no distress   Resp:  Normal effort  MSK:   Moves extremities without difficulty  Other:  Small, well-healed lesion to the left abdomen.  Abdomen soft and nontender.  Medical Decision Making  Medically screening exam initiated at 11:28 PM.  Appropriate orders placed.  Benjamin Butler was informed that the remainder of the evaluation will be completed by another provider, this initial triage assessment does not replace that evaluation, and the importance of remaining in the ED until their evaluation is complete.  Generally feeling unwell, generalized weakness - will need additional work-up   Benjamin Butler, Benjamin Butler 05/26/21 2330    Melene Plan, DO 05/27/21 0005

## 2021-05-27 ENCOUNTER — Other Ambulatory Visit: Payer: Self-pay

## 2021-05-27 LAB — COMPREHENSIVE METABOLIC PANEL
ALT: 16 U/L (ref 0–44)
AST: 23 U/L (ref 15–41)
Albumin: 3.7 g/dL (ref 3.5–5.0)
Alkaline Phosphatase: 62 U/L (ref 38–126)
Anion gap: 7 (ref 5–15)
BUN: 17 mg/dL (ref 6–20)
CO2: 23 mmol/L (ref 22–32)
Calcium: 8.9 mg/dL (ref 8.9–10.3)
Chloride: 107 mmol/L (ref 98–111)
Creatinine, Ser: 1.02 mg/dL (ref 0.61–1.24)
GFR, Estimated: >60 mL/min (ref >60)
Glucose, Bld: 100 mg/dL — ABNORMAL HIGH (ref 70–99)
Potassium: 3.6 mmol/L (ref 3.5–5.1)
Sodium: 137 mmol/L (ref 135–145)
Total Bilirubin: 0.6 mg/dL (ref 0.3–1.2)
Total Protein: 6.2 g/dL — ABNORMAL LOW (ref 6.5–8.1)

## 2021-05-27 LAB — CBC WITH DIFFERENTIAL/PLATELET
Abs Immature Granulocytes: 0.01 10*3/uL (ref 0.00–0.07)
Basophils Absolute: 0.1 10*3/uL (ref 0.0–0.1)
Basophils Relative: 1 %
Eosinophils Absolute: 0.2 10*3/uL (ref 0.0–0.5)
Eosinophils Relative: 3 %
HCT: 39.8 % (ref 39.0–52.0)
Hemoglobin: 12.9 g/dL — ABNORMAL LOW (ref 13.0–17.0)
Immature Granulocytes: 0 %
Lymphocytes Relative: 37 %
Lymphs Abs: 2.9 10*3/uL (ref 0.7–4.0)
MCH: 30.6 pg (ref 26.0–34.0)
MCHC: 32.4 g/dL (ref 30.0–36.0)
MCV: 94.3 fL (ref 80.0–100.0)
Monocytes Absolute: 0.6 10*3/uL (ref 0.1–1.0)
Monocytes Relative: 7 %
Neutro Abs: 4 10*3/uL (ref 1.7–7.7)
Neutrophils Relative %: 52 %
Platelets: 214 10*3/uL (ref 150–400)
RBC: 4.22 MIL/uL (ref 4.22–5.81)
RDW: 13.2 % (ref 11.5–15.5)
WBC: 7.8 10*3/uL (ref 4.0–10.5)
nRBC: 0 % (ref 0.0–0.2)

## 2021-05-27 LAB — URINALYSIS, ROUTINE W REFLEX MICROSCOPIC
Bilirubin Urine: NEGATIVE
Glucose, UA: NEGATIVE mg/dL
Hgb urine dipstick: NEGATIVE
Ketones, ur: NEGATIVE mg/dL
Leukocytes,Ua: NEGATIVE
Nitrite: NEGATIVE
Protein, ur: NEGATIVE mg/dL
Specific Gravity, Urine: 1.025 (ref 1.005–1.030)
pH: 6 (ref 5.0–8.0)

## 2021-05-27 LAB — LIPASE, BLOOD: Lipase: 27 U/L (ref 11–51)

## 2021-05-27 NOTE — ED Provider Notes (Signed)
[facility] EMERGENCY DEPARTMENT Provider Note   CSN: [id number] Arrival date & time: 05/26/21  2140     History Chief Complaint  Patient presents with   generalized weakness    Benjamin Butler is a 45 y.o. male.  The history is provided by the patient.  Benjamin Butler is a 45 y.o. male who presents to the Emergency Department complaining of insect bite.  He presents to the ED for evaluation of fatigue after having an insect bite a few weeks ago.  He states he is tired and his right shoulder hurts when he lies on his right side for 20 minutes.  He also had abdominal discomfort earlier today, now better.  No fever, cough, sob, N/V/D, rash.  Was seen in the ED a few weeks ago for an insect bite that he thought could have been a tick.  At that time he was felt not to be a tick bite.      Past Medical History:  Diagnosis Date   Bipolar 1 disorder [facility])    Depression    Medical history non-contributory     Patient Active Problem List   Diagnosis Date Noted   Psychosis (HCC) 01/08/2014   Cannabis abuse 12/21/2013   Major depressive disorder, single episode, severe, specified as with psychotic behavior 12/20/2013   Homicidal ideation 12/19/2013   Intermittent explosive disorder 12/19/2013    Past Surgical History:  Procedure Laterality Date   ACNE CYST REMOVAL     back       No family history on file.  Social History   Tobacco Use   Smoking status: Heavy Smoker    Packs/day: 1.50    Types: Cigarettes   Smokeless tobacco: Never  Substance Use Topics   Alcohol use: No   Drug use: Yes    Types: Marijuana    Home Medications Prior to Admission medications   Medication Sig Start Date End Date Taking? Authorizing Provider  albuterol (VENTOLIN HFA) 108 (90 Base) MCG/ACT inhaler Inhale into the lungs every 6 (six) hours as needed for wheezing or shortness of breath.    [provider]  cephALEXin (KEFLEX) 500 MG capsule Take 1 capsule (500 mg  total) by mouth 3 (three) times daily. Patient not taking: Reported on 10/06/2014 05/16/14   Mellody Drown, PA-C  FLUoxetine (PROZAC) 40 MG capsule Take 1 capsule (40 mg total) by mouth daily. Patient not taking: Reported on 10/04/2014 01/12/14   Thermon Leyland, NP  HYDROcodone-acetaminophen (NORCO/VICODIN) 5-325 MG per tablet Take 1-2 tablets by mouth every 4 (four) hours as needed. Patient not taking: Reported on 07/09/2020 10/06/14   Linwood Dibbles, MD  hydrocortisone 2.5 % lotion Apply topically 2 (two) times daily. 05/11/21   Charlynne Pander, MD  hydrOXYzine (ATARAX/VISTARIL) 25 MG tablet Take 1 tablet (25 mg total) by mouth every 6 (six) hours. Patient not taking: Reported on 10/04/2014 05/16/14   Mellody Drown, PA-C  lidocaine (XYLOCAINE) 5 % ointment Apply 1 application topically 2 (two) times daily as needed. Patient not taking: Reported on 07/09/2020 10/05/14   Derwood Kaplan, MD  naproxen (NAPROSYN) 500 MG tablet Take 1 tablet (500 mg total) by mouth 2 (two) times daily with a meal. Patient not taking: Reported on 07/09/2020 06/21/19   Garlon Hatchet, PA-C  penicillin v potassium (VEETID) 500 MG tablet Take 1 tablet (500 mg total) by mouth 4 (four) times daily. Patient not taking: Reported on 07/09/2020 06/21/19   Garlon Hatchet, PA-C  predniSONE (STERAPRED UNI-PAK) 10 MG tablet Take 6 tabs by mouth daily  for 2 days, then 5 tabs for 2 days, then 4 tabs for 2 days, then 3 tabs for 2 days, 2 tabs for 2 days, then 1 tab by mouth daily for 2 days Patient not taking: Reported on 07/09/2020 10/06/14   Linwood Dibbles, MD  QUEtiapine (SEROQUEL) 200 MG tablet Take 1 tablet (200 mg total) by mouth at bedtime. Patient not taking: Reported on 10/04/2014 01/12/14   Thermon Leyland, NP  traMADol (ULTRAM) 50 MG tablet Take 1 tablet (50 mg total) by mouth every 6 (six) hours as needed. Patient not taking: Reported on 07/09/2020 05/29/17   Maczis, Elmer Sow, PA-C  traZODone (DESYREL) 50 MG tablet Take 1 tablet (50  mg total) by mouth at bedtime. Patient not taking: Reported on 07/09/2020 01/12/14   Thermon Leyland, NP    Allergies    Patient has no known allergies.  Review of Systems   Review of Systems  All other systems reviewed and are negative.  Physical Exam Updated Vital Signs BP 114/88   Pulse 60   Temp 98 F (36.7 C)   Resp 17   SpO2 98%   Physical Exam Vitals and nursing note reviewed.  Constitutional:      Appearance: He is well-developed.  HENT:     Head: Normocephalic and atraumatic.  Cardiovascular:     Rate and Rhythm: Normal rate and regular rhythm.     Heart sounds: No murmur heard. Pulmonary:     Effort: Pulmonary effort is normal. No respiratory distress.     Breath sounds: Normal breath sounds.  Abdominal:     Palpations: Abdomen is soft.     Tenderness: There is no abdominal tenderness. There is no guarding or rebound.  Musculoskeletal:        General: No swelling, tenderness or deformity. Normal range of motion.  Skin:    General: Skin is warm and dry.     Findings: No rash.  Neurological:     Mental Status: He is alert and oriented to person, place, and time.     Comments: 5/5 strength in all four extremities with sensation to light touch intact in all four extremities.   Psychiatric:        Behavior: Behavior normal.    ED Results / Procedures / Treatments   Labs (all labs ordered are listed, but only abnormal results are displayed) Labs Reviewed  CBC WITH DIFFERENTIAL/PLATELET - Abnormal; Notable for the following components:      Result Value   Hemoglobin 12.9 (*)    All other components within normal limits  COMPREHENSIVE METABOLIC PANEL - Abnormal; Notable for the following components:   Glucose, Bld 100 (*)    Total Protein 6.2 (*)    All other components within normal limits  LIPASE, BLOOD  URINALYSIS, ROUTINE W REFLEX MICROSCOPIC    EKG None  Radiology DG Chest 2 View  Result Date: 05/26/2021 CLINICAL DATA:  Generalized weakness,  tick bite 2 weeks ago, febrile EXAM: CHEST - 2 VIEW COMPARISON:  06/13/2020 FINDINGS: Frontal and lateral views of the chest demonstrate an unremarkable cardiac silhouette. No acute airspace disease, effusion, or pneumothorax. No acute bony abnormalities. IMPRESSION: 1. No acute intrathoracic process. Electronically Signed   By: Sharlet Salina M.D.   On: 05/26/2021 23:55    Procedures Procedures   Medications Ordered in ED Medications - No data to display  ED Course  I have reviewed  the triage vital signs and the nursing notes.  Pertinent labs & imaging results that were available during my care of the patient were reviewed by me and considered in my medical decision making (see chart for details).    MDM Rules/Calculators/A&P                          Pt here for evaluation of feeling unwell, right shoulder pain when he lies on his right side for 20 minutes. He is NVI on exam.  Labs without significant abnormality.  Doubt acute lyme disease.  Plan to d/c home with outpatient follow up and return precautions.    Final Clinical Impression(s) / ED Diagnoses Final diagnoses:  Malaise and fatigue    Rx / DC Orders ED Discharge Orders     None        Tilden Fossa, MD 05/27/21 [phone]

## 2021-07-21 ENCOUNTER — Encounter (HOSPITAL_COMMUNITY): Payer: Self-pay | Admitting: Student

## 2021-07-21 ENCOUNTER — Other Ambulatory Visit: Payer: Self-pay

## 2021-07-21 ENCOUNTER — Emergency Department (HOSPITAL_COMMUNITY)
Admission: EM | Admit: 2021-07-21 | Discharge: 2021-07-21 | Disposition: A | Discharge status: Home / Self Care | Payer: Self-pay | Attending: Emergency Medicine | Admitting: Emergency Medicine

## 2021-07-21 DIAGNOSIS — K029 Dental caries, unspecified: Secondary | ICD-10-CM | POA: Insufficient documentation

## 2021-07-21 DIAGNOSIS — F17210 Nicotine dependence, cigarettes, uncomplicated: Secondary | ICD-10-CM | POA: Insufficient documentation

## 2021-07-21 DIAGNOSIS — K0889 Other specified disorders of teeth and supporting structures: Secondary | ICD-10-CM

## 2021-07-21 MED ORDER — LIDOCAINE VISCOUS HCL 2 % MT SOLN: 15.0000 mL | OROMUCOSAL | 0 refills | Status: DC | PRN

## 2021-07-21 MED ORDER — PENICILLIN V POTASSIUM 500 MG PO TABS: 500.0000 mg | ORAL_TABLET | Freq: Four times a day (QID) | ORAL | 0 refills | Status: AC

## 2021-07-21 MED ORDER — NAPROXEN 500 MG PO TABS: 500.0000 mg | ORAL_TABLET | Freq: Two times a day (BID) | ORAL | 0 refills | Status: DC | PRN

## 2021-07-21 NOTE — ED Provider Notes (Signed)
[facility] EMERGENCY DEPARTMENT Provider Note   CSN: [id number] Arrival date & time: 07/21/21  9485     History Chief Complaint  Patient presents with   Dental Pain    DWYNE HASEGAWA is a 45 y.o. male with a hx of bipolar 1 disorder and depression who presents to the ED with complaints of dental pain x 1 week. Pain is to the right upper molars and to the right lower molar, constant, worse with chewing, alleviated minimally by ibuprofen. Relays he is planning to have additional teeth extracted however wants to save some money for this first. Denies fever, chills, N/V, dysphagia, or voice change.   HPI     Past Medical History:  Diagnosis Date   Bipolar 1 disorder [facility])    Depression    Medical history non-contributory     Patient Active Problem List   Diagnosis Date Noted   Psychosis (HCC) 01/08/2014   Cannabis abuse 12/21/2013   Major depressive disorder, single episode, severe, specified as with psychotic behavior 12/20/2013   Homicidal ideation 12/19/2013   Intermittent explosive disorder 12/19/2013    Past Surgical History:  Procedure Laterality Date   ACNE CYST REMOVAL     back       History reviewed. No pertinent family history.  Social History   Tobacco Use   Smoking status: Heavy Smoker    Packs/day: 1.50    Types: Cigarettes   Smokeless tobacco: Never  Substance Use Topics   Alcohol use: No   Drug use: Yes    Types: Marijuana    Home Medications Prior to Admission medications   Medication Sig Start Date End Date Taking? Authorizing Provider  lidocaine (XYLOCAINE) 2 % solution Use as directed 15 mLs in the mouth or throat every 4 (four) hours as needed for mouth pain. 07/21/21  Yes Teosha Casso R, PA-C  albuterol (VENTOLIN HFA) 108 (90 Base) MCG/ACT inhaler Inhale into the lungs every 6 (six) hours as needed for wheezing or shortness of breath.    [provider]  hydrocortisone 2.5 % lotion Apply topically 2  (two) times daily. 05/11/21   Charlynne Pander, MD  naproxen (NAPROSYN) 500 MG tablet Take 1 tablet (500 mg total) by mouth 2 (two) times daily as needed for moderate pain. 07/21/21  Yes Tomoya Ringwald R, PA-C  penicillin v potassium (VEETID) 500 MG tablet Take 1 tablet (500 mg total) by mouth 4 (four) times daily for 7 days. 07/21/21 07/28/21  Damisha Wolff, Pleas Koch, PA-C    Allergies    Patient has no known allergies.  Review of Systems   Review of Systems  Constitutional:  Negative for chills and fever.  HENT:  Positive for dental problem. Negative for trouble swallowing and voice change.   Respiratory:  Negative for shortness of breath.   Cardiovascular:  Negative for chest pain.  Gastrointestinal:  Negative for abdominal pain, nausea and vomiting.  Neurological:  Negative for syncope.  All other systems reviewed and are negative.  Physical Exam Updated Vital Signs BP 127/81   Pulse 62   Resp 16   Ht 6\' 1"  (1.854 m)   Wt 94.8 kg   SpO2 100%   BMI 27.57 kg/m  Temp: 98.2 oral F.   Physical Exam Vitals and nursing note reviewed.  Constitutional:      General: He is not in acute distress.    Appearance: He is well-developed. He is not toxic-appearing.  HENT:     Head: Normocephalic  and atraumatic.     Right Ear: Tympanic membrane is not perforated, erythematous, retracted or bulging.     Left Ear: Tympanic membrane is not perforated, erythematous, retracted or bulging.     Nose: Nose normal.     Mouth/Throat:     Pharynx: Uvula midline. No oropharyngeal exudate or posterior oropharyngeal erythema.     Comments: Multiple missing teeth. Right upper and lower bicuspid/molars with decay, surrounding gingival swelling and tenderness to palpation. No fluctuance, no gross abscess.   Posterior oropharynx is symmetric appearing. Patient tolerating own secretions without difficulty. No trismus. No drooling. No hot potato voice. No swelling beneath the tongue, submandibular  compartment is soft.    Eyes:     General:        Right eye: No discharge.        Left eye: No discharge.     Conjunctiva/sclera: Conjunctivae normal.  Cardiovascular:     Rate and Rhythm: Normal rate.  Pulmonary:     Effort: Pulmonary effort is normal.  Musculoskeletal:     Cervical back: Normal range of motion and neck supple.  Lymphadenopathy:     Cervical: No cervical adenopathy.  Neurological:     Mental Status: He is alert.  Psychiatric:        Behavior: Behavior normal.        Thought Content: Thought content normal.    ED Results / Procedures / Treatments   Labs (all labs ordered are listed, but only abnormal results are displayed) Labs Reviewed - No data to display  EKG None  Radiology No results found.  Procedures Procedures   Medications Ordered in ED Medications - No data to display  ED Course  I have reviewed the triage vital signs and the nursing notes.  Pertinent labs & imaging results that were available during my care of the patient were reviewed by me and considered in my medical decision making (see chart for details).    MDM Rules/Calculators/A&P                           Patient presents with dental pain. Patient is nontoxic appearing, vitals without significant abnormality. No gross abscess.  Exam unconcerning for Ludwig's angina or deep space infection at this time. Additional hx from chart review- last creatinine WNL.  Will treat with Penicillin VK and Naproxen.  Urged patient to follow-up with dentist, dental resources were provided.  Discussed treatment plan and need for follow up as well as return precautions. Provided opportunity for questions, patient confirmed understanding and is agreeable to plan.  Final Clinical Impression(s) / ED Diagnoses Final diagnoses:  Pain, dental    Rx / DC Orders ED Discharge Orders          Ordered    naproxen (NAPROSYN) 500 MG tablet  2 times daily PRN        07/21/21 0251    penicillin v  potassium (VEETID) 500 MG tablet  4 times daily        07/21/21 0251    lidocaine (XYLOCAINE) 2 % solution  Every 4 hours PRN        07/21/21 0258             Cherly Anderson, PA-C 07/21/21 0331    Sabas Sous, MD 07/21/21 [phone]

## 2021-07-21 NOTE — ED Triage Notes (Signed)
The pt is c/o a toothache for one week

## 2021-07-21 NOTE — Discharge Instructions (Addendum)
Call one of the dentists offices provided to schedule an appointment for re-evaluation and further management within the next 48 hours.  ° °I have prescribed you Penicillin which is an antibiotic to treat the infection and Naproxen which is an anti-inflammatory medicine to treat the pain.  ° °Please take all of your antibiotics until finished. You may develop abdominal discomfort or diarrhea from the antibiotic.  You may help offset this with probiotics which you can buy at the store (ask your pharmacist if unable to find) or get probiotics in the form of eating yogurt. Do not eat or take the probiotics until 2 hours after your antibiotic. If you are unable to tolerate these side effects follow-up with your primary care provider or return to the emergency department.  ° °If you begin to experience any blistering, rashes, swelling, or difficulty breathing seek medical care for evaluation of potentially more serious side effects.  ° °Be sure to eat something when taking the Naproxen as it can cause stomach upset and at worst stomach bleeding. Do not take additional non steroidal anti-inflammatory medicines such as Ibuprofen, Aleve, Advil, Mobic, Diclofenac, or goodie powder while taking Naproxen. You may supplement with Tylenol.  ° °We have prescribed you new medication(s) today. Discuss the medications prescribed today with your pharmacist as they can have adverse effects and interactions with your other medicines including over the counter and prescribed medications. Seek medical evaluation if you start to experience new or abnormal symptoms after taking one of these medicines, seek care immediately if you start to experience difficulty breathing, feeling of your throat closing, facial swelling, or rash as these could be indications of a more serious allergic reaction ° °If you start to experience and new or worsening symptoms return to the emergency department. If you start to experience fever, chills, neck  stiffness/pain, or inability to move your neck or open your mouth come back to the emergency department immediately.  ° °

## 2021-09-21 ENCOUNTER — Other Ambulatory Visit: Payer: Self-pay

## 2021-09-21 ENCOUNTER — Encounter (HOSPITAL_COMMUNITY): Payer: Self-pay | Admitting: *Deleted

## 2021-09-21 ENCOUNTER — Emergency Department (HOSPITAL_COMMUNITY)
Admission: EM | Admit: 2021-09-21 | Discharge: 2021-09-22 | Disposition: A | Discharge status: Home / Self Care | Payer: Self-pay | Attending: Emergency Medicine | Admitting: Emergency Medicine

## 2021-09-21 DIAGNOSIS — L539 Erythematous condition, unspecified: Secondary | ICD-10-CM | POA: Insufficient documentation

## 2021-09-21 DIAGNOSIS — Z5321 Procedure and treatment not carried out due to patient leaving prior to being seen by health care provider: Secondary | ICD-10-CM | POA: Insufficient documentation

## 2021-09-21 DIAGNOSIS — M79671 Pain in right foot: Secondary | ICD-10-CM | POA: Insufficient documentation

## 2021-09-21 NOTE — ED Triage Notes (Signed)
Rt foot pain for 3 days no known injuiruy

## 2021-09-21 NOTE — ED Provider Notes (Signed)
MSE was initiated and I personally evaluated the patient and placed orders (if any) at  11:52 PM on September 21, 2021.  Patient to ED with c/o right foot pain and swelling without injury x 3 days. Reports redness.   Today's Vitals   09/21/21 2322  BP: 104/70  Pulse: 67  Resp: (!) 22  Temp: (!) 97.5 F (36.4 C)  TempSrc: Oral  SpO2: 99%  Weight: 94.8 kg  Height: 6\' 1"  (1.854 m)  PainSc: 10-Worst pain ever   Body mass index is 27.57 kg/m.  Right foot swollen, red warm to distal forefoot. No skin breakdown. No nail infection visualized.   The patient appears stable so that the remainder of the MSE may be completed by another provider.   , PA-C 09/21/21 2353    09/23/21, MD 09/22/21 [phone]

## 2021-09-22 ENCOUNTER — Encounter (HOSPITAL_COMMUNITY): Payer: Self-pay | Admitting: Emergency Medicine

## 2021-09-22 ENCOUNTER — Other Ambulatory Visit: Payer: Self-pay

## 2021-09-22 ENCOUNTER — Ambulatory Visit (HOSPITAL_COMMUNITY)
Admission: EM | Admit: 2021-09-22 | Discharge: 2021-09-22 | Disposition: A | Discharge status: Home / Self Care | Payer: Self-pay | Attending: Sports Medicine | Admitting: Sports Medicine

## 2021-09-22 DIAGNOSIS — M2141 Flat foot [pes planus] (acquired), right foot: Secondary | ICD-10-CM

## 2021-09-22 DIAGNOSIS — M2142 Flat foot [pes planus] (acquired), left foot: Secondary | ICD-10-CM

## 2021-09-22 DIAGNOSIS — M79671 Pain in right foot: Secondary | ICD-10-CM

## 2021-09-22 LAB — CBC WITH DIFFERENTIAL/PLATELET
Abs Immature Granulocytes: 0.02 10*3/uL (ref 0.00–0.07)
Basophils Absolute: 0.1 10*3/uL (ref 0.0–0.1)
Basophils Relative: 1 %
Eosinophils Absolute: 0.1 10*3/uL (ref 0.0–0.5)
Eosinophils Relative: 2 %
HCT: 41.1 % (ref 39.0–52.0)
Hemoglobin: 14 g/dL (ref 13.0–17.0)
Immature Granulocytes: 0 %
Lymphocytes Relative: 27 %
Lymphs Abs: 2.6 10*3/uL (ref 0.7–4.0)
MCH: 31.5 pg (ref 26.0–34.0)
MCHC: 34.1 g/dL (ref 30.0–36.0)
MCV: 92.6 fL (ref 80.0–100.0)
Monocytes Absolute: 0.9 10*3/uL (ref 0.1–1.0)
Monocytes Relative: 9 %
Neutro Abs: 5.8 10*3/uL (ref 1.7–7.7)
Neutrophils Relative %: 61 %
Platelets: 214 10*3/uL (ref 150–400)
RBC: 4.44 MIL/uL (ref 4.22–5.81)
RDW: 12.8 % (ref 11.5–15.5)
WBC: 9.4 10*3/uL (ref 4.0–10.5)
nRBC: 0 % (ref 0.0–0.2)

## 2021-09-22 LAB — BASIC METABOLIC PANEL
Anion gap: 8 (ref 5–15)
BUN: 11 mg/dL (ref 6–20)
CO2: 26 mmol/L (ref 22–32)
Calcium: 9 mg/dL (ref 8.9–10.3)
Chloride: 103 mmol/L (ref 98–111)
Creatinine, Ser: 0.89 mg/dL (ref 0.61–1.24)
GFR, Estimated: >60 mL/min (ref >60)
Glucose, Bld: 102 mg/dL — ABNORMAL HIGH (ref 70–99)
Potassium: 3.8 mmol/L (ref 3.5–5.1)
Sodium: 137 mmol/L (ref 135–145)

## 2021-09-22 NOTE — ED Triage Notes (Signed)
Pt c/o swelling to right foot for 4 days.

## 2021-09-22 NOTE — Discharge Instructions (Signed)
Ice the painful area with ice 20 minutes 3 times a day  Continue taking ibuprofen twice daily with food for the rest of this week, then only as needed from there  Be sure to follow-up with podiatry or sports medicine center over the next week  If the pain increases significantly, the redness returns or you are having any symptoms of fever or chills or self, recommend reevaluation here in the urgent care/ED

## 2021-09-22 NOTE — ED Notes (Signed)
Called for vitals x5 °

## 2021-09-22 NOTE — ED Notes (Signed)
Called 6x in waiting area. No response

## 2021-09-22 NOTE — ED Provider Notes (Signed)
[place]    CSN: [id number] Arrival date & time: 09/22/21  1645      History   Chief Complaint Chief Complaint  Patient presents with   Foot Pain    HPI Benjamin Butler is a 45 y.o. male who presents for right dorsal foot pain x3-4 days   Foot Pain   Right dorsal foot pain Started about 3 days ago - on top of foot No injury or inciting event but states he has been walking a lot recently. Pain moreso over dorsum of foot between 2-4 toes.  No history of gout Was a little more red yesterday - had blood work in ED without leukocytosis.  Pain is feeling better today.  Today, 6-10.  No personal history of diabetes. Has been taking ibuprofen at least once daily which has improved his pain.  He wanted to get his foot checked out, because his dad is a diabetic and recently passed away from a foot infection that spread systemically.  Past Medical History:  Diagnosis Date   Bipolar 1 disorder [facility])    Depression    Medical history non-contributory     Patient Active Problem List   Diagnosis Date Noted   Psychosis ([place]) 01/08/2014   Cannabis abuse 12/21/2013   Major depressive disorder, single episode, severe, specified as with psychotic behavior 12/20/2013   Homicidal ideation 12/19/2013   Intermittent explosive disorder 12/19/2013    Past Surgical History:  Procedure Laterality Date   ACNE CYST REMOVAL     back       Home Medications    Prior to Admission medications   Medication Sig Start Date End Date Taking? Authorizing Provider  albuterol (VENTOLIN HFA) 108 (90 Base) MCG/ACT inhaler Inhale into the lungs every 6 (six) hours as needed for wheezing or shortness of breath.    [provider]  hydrocortisone 2.5 % lotion Apply topically 2 (two) times daily. 05/11/21   Drenda Freeze, MD  lidocaine (XYLOCAINE) 2 % solution Use as directed 15 mLs in the mouth or throat every 4 (four) hours as needed for mouth pain. 07/21/21   Petrucelli,  Samantha R, PA-C  naproxen (NAPROSYN) 500 MG tablet Take 1 tablet (500 mg total) by mouth 2 (two) times daily as needed for moderate pain. 07/21/21   Petrucelli, Glynda Jaeger, PA-C    Family History No family history on file.  Social History Social History   Tobacco Use   Smoking status: Heavy Smoker    Packs/day: 1.50    Types: Cigarettes   Smokeless tobacco: Never  Substance Use Topics   Alcohol use: No   Drug use: Yes    Types: Marijuana     Allergies   Patient has no known allergies.   Review of Systems Review of Systems  Constitutional:  Negative for chills and fever.  Musculoskeletal:  Positive for arthralgias (right dorsal foot pain) and joint swelling.    Physical Exam Triage Vital Signs ED Triage Vitals  Enc Vitals Group     BP 09/22/21 1832 117/81     Pulse Rate 09/22/21 1832 70     Resp 09/22/21 1832 16     Temp 09/22/21 1832 98.3 F (36.8 C)     Temp Source 09/22/21 1832 Oral     SpO2 09/22/21 1832 98 %     Weight --      Height --      Head Circumference --      Peak Flow --  Pain Score 09/22/21 1831 7     Pain Loc --      Pain Edu? --      Excl. in GC? --    No data found.  Updated Vital Signs BP 117/81 (BP Location: Left Arm)   Pulse 70   Temp 98.3 F (36.8 C) (Oral)   Resp 16   SpO2 98%    Physical Exam Gen: Well-appearing, in no acute distress; non-toxic CV: Regular Rate. Well-perfused. Warm.  Resp: Breathing unlabored on room air; no wheezing. Psych: Fluid speech in conversation; appropriate affect; normal thought process Neuro: Sensation intact throughout. No gross coordination deficits.  MSK:  - Right foot: + Mild TTP noted in the distal metatarsals of the dorsum of the second-fourth toes.  There is mild soft tissue swelling around this area.  No erythema, ecchymosis or warmth to the area.  Able to wiggle all 5 toes without difficulty.  No redness or TTP over the first MTP.  There is pes planus noted bilaterally.  Range of  motion full about the ankle in all directions.  5/5 strength.  Sensation to light touch intact throughout the dorsal and plantar aspect of the foot.  Neurovascular intact distally.  Nonantalgic gait.  Very dry skin on the bottom of the foot with callus formation.  Negative Morton's click negative calcaneal squeeze.    UC Treatments / Results  Labs (all labs ordered are listed, but only abnormal results are displayed) Labs Reviewed - No data to display  EKG   Radiology No results found.  Procedures Procedures (including critical care time)  Medications Ordered in UC Medications - No data to display  Initial Impression / Assessment and Plan / UC Course  I have reviewed the triage vital signs and the nursing notes.  Pertinent labs & imaging results that were available during my care of the patient were reviewed by me and considered in my medical decision making (see chart for details).    Right dorsal foot pain Pes planus Xerosis of feet  Pain of the right foot over the dorsal side after increased walking.  Patient has had notable pes planus.  Etiology unclear, but more so likely extensor tendinitis versus metatarsalgia.  Reassuring that he did not have any leukocytosis drawn on labs yesterday in the ED.  There is no redness or warmth so gout less likely.  No open wounds or lacerations noted.  Pain has improved with over-the-counter ibuprofen.  Discussed icing the area, continue anti-inflammatories over the next week, then as needed from there.  Described importance of follow-up with podiatry or sports medicine clinic over this next week.  Strict return precautions provided and discussed including but not limited to increased pain, any redness or warmth to the area, or loss of range of motion.  He is safe for discharge home.  Final Clinical Impressions(s) / UC Diagnoses   Final diagnoses:  Foot pain, right  Pes planus of both feet     Discharge Instructions      Ice the  painful area with ice 20 minutes 3 times a day  Continue taking ibuprofen twice daily with food for the rest of this week, then only as needed from there  Be sure to follow-up with podiatry or sports medicine center over the next week  If the pain increases significantly, the redness returns or you are having any symptoms of fever or chills or self, recommend reevaluation here in the urgent care/ED     ED Prescriptions  None    PDMP not reviewed this encounter.   Elba Barman, DO 09/22/21 1930

## 2021-09-26 ENCOUNTER — Encounter (HOSPITAL_COMMUNITY): Payer: Self-pay

## 2021-09-26 ENCOUNTER — Emergency Department (HOSPITAL_COMMUNITY)
Admission: EM | Admit: 2021-09-26 | Discharge: 2021-09-26 | Disposition: A | Discharge status: Home / Self Care | Payer: Self-pay | Attending: Emergency Medicine | Admitting: Emergency Medicine

## 2021-09-26 ENCOUNTER — Other Ambulatory Visit: Payer: Self-pay

## 2021-09-26 DIAGNOSIS — F17210 Nicotine dependence, cigarettes, uncomplicated: Secondary | ICD-10-CM | POA: Insufficient documentation

## 2021-09-26 DIAGNOSIS — K0889 Other specified disorders of teeth and supporting structures: Secondary | ICD-10-CM

## 2021-09-26 DIAGNOSIS — K029 Dental caries, unspecified: Secondary | ICD-10-CM | POA: Insufficient documentation

## 2021-09-26 MED ORDER — LIDOCAINE VISCOUS HCL 2 % MT SOLN: 15.0000 mL | OROMUCOSAL | 0 refills | Status: AC | PRN

## 2021-09-26 MED ORDER — AMOXICILLIN 500 MG PO CAPS: 500.0000 mg | ORAL_CAPSULE | Freq: Three times a day (TID) | ORAL | 0 refills | Status: DC

## 2021-09-26 MED ORDER — LIDOCAINE VISCOUS HCL 2 % MT SOLN
15.0000 mL | Freq: Once | OROMUCOSAL | Status: AC
  Administered 2021-09-26: 15 mL via OROMUCOSAL
  Filled 2021-09-26: qty 15

## 2021-09-26 MED ORDER — AMOXICILLIN 500 MG PO CAPS
500.0000 mg | ORAL_CAPSULE | Freq: Once | ORAL | Status: AC
  Administered 2021-09-26: 500 mg via ORAL
  Filled 2021-09-26: qty 1

## 2021-09-26 NOTE — ED Triage Notes (Signed)
Patient arrived with complaints of dental pain over the last week.

## 2021-09-26 NOTE — Discharge Instructions (Addendum)
Take ibuprofen 3 times a day with meals.  Do not take other anti-inflammatories at the same time open (Advil, Motrin, naproxen, Aleve). You may supplement with Tylenol if you need further pain control. Use viscous lidocaine to help with pain control. Take antibiotics as prescribed to treat for any potential infection. It is extremely important you follow-up with your dentist for further evaluation.  Return to the ER with any new, worsening, or concerning symptoms.

## 2021-09-26 NOTE — ED Provider Notes (Signed)
[facility]-EMERGENCY DEPT Provider Note   CSN: [id number] Arrival date & time: 09/26/21  0203     History Chief Complaint  Patient presents with   Dental Pain    Benjamin Butler is a 45 y.o. male presenting for evaluation of right upper and right lower tooth pain.  Patient states about 2 months ago he had multiple teeth pulled.  He was having some residual discomfort, and had a repeat visit with a dentist last week.  He states they manipulated his tooth, and since then, he has had increased pain.  He no longer feels like his teeth are lining up the right way.  He has been taking Advil, 3 to 4 tablets 3 times a day with minimal improvement of his symptoms.  He denies fevers or chills.  No pain on the left side.  No trauma or injury.  No bleeding or drainage.  He has no other medical problems, takes no medications daily.  Pain is constant, worse with chewing.  He has radiating pain to his head.  HPI     Past Medical History:  Diagnosis Date   Bipolar 1 disorder [facility])    Depression    Medical history non-contributory     Patient Active Problem List   Diagnosis Date Noted   Psychosis (HCC) 01/08/2014   Cannabis abuse 12/21/2013   Major depressive disorder, single episode, severe, specified as with psychotic behavior 12/20/2013   Homicidal ideation 12/19/2013   Intermittent explosive disorder 12/19/2013    Past Surgical History:  Procedure Laterality Date   ACNE CYST REMOVAL     back       History reviewed. No pertinent family history.  Social History   Tobacco Use   Smoking status: Heavy Smoker    Packs/day: 1.50    Types: Cigarettes   Smokeless tobacco: Never  Substance Use Topics   Alcohol use: No   Drug use: Yes    Types: Marijuana    Home Medications Prior to Admission medications   Medication Sig Start Date End Date Taking? Authorizing Provider  amoxicillin (AMOXIL) 500 MG capsule Take 1 capsule (500 mg total) by mouth 3 (three) times  daily. 09/26/21  Yes Everette Dimauro, PA-C  lidocaine (XYLOCAINE) 2 % solution Use as directed 15 mLs in the mouth or throat as needed for mouth pain. 09/26/21  Yes Kristopher Delk, PA-C  albuterol (VENTOLIN HFA) 108 (90 Base) MCG/ACT inhaler Inhale into the lungs every 6 (six) hours as needed for wheezing or shortness of breath.    [provider]  hydrocortisone 2.5 % lotion Apply topically 2 (two) times daily. 05/11/21   Charlynne Pander, MD  naproxen (NAPROSYN) 500 MG tablet Take 1 tablet (500 mg total) by mouth 2 (two) times daily as needed for moderate pain. 07/21/21   Petrucelli, Pleas Koch, PA-C    Allergies    Patient has no known allergies.  Review of Systems   Review of Systems  Constitutional:  Negative for fever.  HENT:  Positive for dental problem.    Physical Exam Updated Vital Signs BP 126/81 (BP Location: Left Arm)   Pulse 83   Temp 97.9 F (36.6 C) (Oral)   Resp 16   Ht 6\' 1"  (1.854 m)   Wt 90.7 kg   SpO2 100%   BMI 26.39 kg/m   Physical Exam Vitals and nursing note reviewed.  Constitutional:      General: He is not in acute distress.    Appearance:  He is well-developed.  HENT:     Head: Normocephalic and atraumatic.     Mouth/Throat:     Dentition: Abnormal dentition. Dental tenderness and gingival swelling present.      Comments: Overall poor dentition with multiple dental caries and multiple pulled teeth.  Ttp of R upper and R lower teeth. Mild surrounding gum edema. No drainage.  No facial swelling.  No pain under the tongue.  Handling secretions easily.  No trismus. Eyes:     Extraocular Movements: Extraocular movements intact.  Cardiovascular:     Rate and Rhythm: Normal rate and regular rhythm.  Pulmonary:     Effort: Pulmonary effort is normal.     Breath sounds: Normal breath sounds.  Abdominal:     General: There is no distension.  Musculoskeletal:        General: Normal range of motion.     Cervical back: Normal range of  motion.  Skin:    General: Skin is warm.  Neurological:     Mental Status: He is alert and oriented to person, place, and time.    ED Results / Procedures / Treatments   Labs (all labs ordered are listed, but only abnormal results are displayed) Labs Reviewed - No data to display  EKG None  Radiology No results found.  Procedures Procedures   Medications Ordered in ED Medications  lidocaine (XYLOCAINE) 2 % viscous mouth solution 15 mL (has no administration in time range)  amoxicillin (AMOXIL) capsule 500 mg (has no administration in time range)    ED Course  I have reviewed the triage vital signs and the nursing notes.  Pertinent labs & imaging results that were available during my care of the patient were reviewed by me and considered in my medical decision making (see chart for details).    MDM Rules/Calculators/A&P                           Patient presenting for evaluation of dental pain.  On exam, patient peers nontoxic.  He does have tenderness of the right upper and right lower teeth.  In the setting of worsening pain, consider infection especially with recent extraction.  Also consider malalignment.  Will give viscous lidocaine for pain control, will cover with antibiotics, and have patient follow-up with dentistry.  At this time, patient appears safe for discharge.  Return precautions given.  Patient states he understands and agrees to plan  Final Clinical Impression(s) / ED Diagnoses Final diagnoses:  Pain, dental    Rx / DC Orders ED Discharge Orders          Ordered    lidocaine (XYLOCAINE) 2 % solution  As needed        09/26/21 0245    amoxicillin (AMOXIL) 500 MG capsule  3 times daily        09/26/21 0245             Alveria Apley, PA-C 09/26/21 0248    Molpus, Jonny Ruiz, MD 09/26/21 [phone]

## 2021-10-02 ENCOUNTER — Other Ambulatory Visit: Payer: Self-pay

## 2021-10-02 ENCOUNTER — Encounter (HOSPITAL_COMMUNITY): Payer: Self-pay

## 2021-10-02 ENCOUNTER — Emergency Department (HOSPITAL_COMMUNITY)
Admission: EM | Admit: 2021-10-02 | Discharge: 2021-10-02 | Disposition: A | Discharge status: Home / Self Care | Payer: Self-pay | Attending: Emergency Medicine | Admitting: Emergency Medicine

## 2021-10-02 ENCOUNTER — Ambulatory Visit (HOSPITAL_COMMUNITY)
Admission: EM | Admit: 2021-10-02 | Discharge: 2021-10-02 | Disposition: A | Discharge status: Home / Self Care | Payer: Self-pay | Attending: Physician Assistant | Admitting: Physician Assistant

## 2021-10-02 DIAGNOSIS — Z77120 Contact with and (suspected) exposure to mold (toxic): Secondary | ICD-10-CM | POA: Insufficient documentation

## 2021-10-02 DIAGNOSIS — R0982 Postnasal drip: Secondary | ICD-10-CM

## 2021-10-02 DIAGNOSIS — R051 Acute cough: Secondary | ICD-10-CM

## 2021-10-02 DIAGNOSIS — H5789 Other specified disorders of eye and adnexa: Secondary | ICD-10-CM | POA: Insufficient documentation

## 2021-10-02 DIAGNOSIS — R059 Cough, unspecified: Secondary | ICD-10-CM | POA: Insufficient documentation

## 2021-10-02 DIAGNOSIS — F17210 Nicotine dependence, cigarettes, uncomplicated: Secondary | ICD-10-CM | POA: Insufficient documentation

## 2021-10-02 DIAGNOSIS — R070 Pain in throat: Secondary | ICD-10-CM | POA: Insufficient documentation

## 2021-10-02 DIAGNOSIS — R0981 Nasal congestion: Secondary | ICD-10-CM

## 2021-10-02 MED ORDER — CETIRIZINE HCL 10 MG PO TABS: 10.0000 mg | ORAL_TABLET | Freq: Every day | ORAL | 0 refills | Status: AC

## 2021-10-02 NOTE — ED Provider Notes (Signed)
[facility] EMERGENCY DEPARTMENT Provider Note   CSN: [id number] Arrival date & time: 10/02/21  0055     History Chief Complaint  Patient presents with   Cough    Benjamin Butler is a 45 y.o. male.  45 year old male with history of bipolar disorder, depression, daily smoker presents with concern for exposure to mold in his car.  Patient states that he has irritation in his throat, eyes, nonproductive cough.  States that he had water in his car which has now turned to mold.  No other complaints or concerns today.      Past Medical History:  Diagnosis Date   Bipolar 1 disorder [facility])    Depression    Medical history non-contributory     Patient Active Problem List   Diagnosis Date Noted   Psychosis ([place]) 01/08/2014   Cannabis abuse 12/21/2013   Major depressive disorder, single episode, severe, specified as with psychotic behavior 12/20/2013   Homicidal ideation 12/19/2013   Intermittent explosive disorder 12/19/2013    Past Surgical History:  Procedure Laterality Date   ACNE CYST REMOVAL     back       History reviewed. No pertinent family history.  Social History   Tobacco Use   Smoking status: Heavy Smoker    Packs/day: 1.50    Types: Cigarettes   Smokeless tobacco: Never  Substance Use Topics   Alcohol use: No   Drug use: Yes    Types: Marijuana    Home Medications Prior to Admission medications   Medication Sig Start Date End Date Taking? Authorizing Provider  albuterol (VENTOLIN HFA) 108 (90 Base) MCG/ACT inhaler Inhale into the lungs every 6 (six) hours as needed for wheezing or shortness of breath.    [provider]  amoxicillin (AMOXIL) 500 MG capsule Take 1 capsule (500 mg total) by mouth 3 (three) times daily. 09/26/21   Caccavale, Sophia, PA-C  hydrocortisone 2.5 % lotion Apply topically 2 (two) times daily. 05/11/21   Drenda Freeze, MD  lidocaine (XYLOCAINE) 2 % solution Use as directed 15 mLs in the mouth or  throat as needed for mouth pain. 09/26/21   Caccavale, Sophia, PA-C  naproxen (NAPROSYN) 500 MG tablet Take 1 tablet (500 mg total) by mouth 2 (two) times daily as needed for moderate pain. 07/21/21   Petrucelli, Glynda Jaeger, PA-C    Allergies    Patient has no known allergies.  Review of Systems   Review of Systems  Constitutional:  Negative for chills and fever.  HENT:  Positive for sore throat.   Eyes:  Positive for itching.  Respiratory:  Positive for cough.   Musculoskeletal:  Negative for arthralgias and myalgias.  Skin:  Negative for rash and wound.  Allergic/Immunologic: Negative for immunocompromised state.  Neurological:  Negative for weakness.  Hematological:  Negative for adenopathy.  All other systems reviewed and are negative.  Physical Exam Updated Vital Signs BP 125/81   Pulse 70   Temp 97.8 F (36.6 C) (Oral)   Resp 18   SpO2 99%   Physical Exam Vitals and nursing note reviewed.  Constitutional:      General: He is not in acute distress.    Appearance: He is well-developed. He is not diaphoretic.  HENT:     Head: Normocephalic and atraumatic.     Nose: Nose normal. No congestion.     Mouth/Throat:     Mouth: Mucous membranes are moist.     Pharynx: No oropharyngeal exudate  or posterior oropharyngeal erythema.  Eyes:     Conjunctiva/sclera: Conjunctivae normal.  Cardiovascular:     Rate and Rhythm: Normal rate and regular rhythm.     Heart sounds: Normal heart sounds.  Pulmonary:     Effort: Pulmonary effort is normal.     Breath sounds: Normal breath sounds.  Musculoskeletal:     Cervical back: Neck supple.  Lymphadenopathy:     Cervical: No cervical adenopathy.  Skin:    General: Skin is warm and dry.     Findings: No erythema or rash.  Neurological:     Mental Status: He is alert and oriented to person, place, and time.  Psychiatric:        Behavior: Behavior normal.    ED Results / Procedures / Treatments   Labs (all labs ordered are  listed, but only abnormal results are displayed) Labs Reviewed - No data to display  EKG None  Radiology No results found.  Procedures Procedures   Medications Ordered in ED Medications - No data to display  ED Course  I have reviewed the triage vital signs and the nursing notes.  Pertinent labs & imaging results that were available during my care of the patient were reviewed by me and considered in my medical decision making (see chart for details).  Clinical Course as of 10/02/21 0118  Thu Oct 02, 2021  [id number] 45 year old male with concern for exposure to mold as above.  On exam the patient is well-appearing.  Lungs are clear to auscultation.  Vitals reviewed and reassuring, patient is afebrile with O2 sat 99% on room air.  Recommend smoking cessation to avoid further surgery to inhaled irritants. [LM]    Clinical Course User Index [LM] Alden Hipp   MDM Rules/Calculators/A&P                           Final Clinical Impression(s) / ED Diagnoses Final diagnoses:  Cough, unspecified type  Mold exposure    Rx / DC Orders ED Discharge Orders     None        Jeannie Fend, PA-C 10/02/21 0119    Sabas Sous, MD 10/02/21 [phone]

## 2021-10-02 NOTE — Discharge Instructions (Signed)
Take Zyrtec daily. Limit exposure to inhaled irritants.

## 2021-10-02 NOTE — ED Triage Notes (Signed)
Pt states had mold exposure last night while cutting wet carpet from his car. Pt c/o headache and itchy throat.

## 2021-10-02 NOTE — Discharge Instructions (Signed)
It is very important that we avoid additional exposures.  If you are going to have to remove the carpet with mold please wear an N95 and goggles in the make sure to take a shower immediately after.  Start Zyrtec daily.  Make sure you rest and drink plenty of fluid.  If you have persistent or worsening symptoms please return for reevaluation as we discussed.

## 2021-10-02 NOTE — ED Provider Notes (Signed)
[facility]    CSN: [id number] Arrival date & time: 10/02/21  1923      History   Chief Complaint Chief Complaint  Patient presents with   Headache    HPI Benjamin Butler is a 45 y.o. male.   Patient presents today after being exposed to mold.  Reports that the weather stripping in his car is faulty and rain has been getting into his car which is caused the carpet to mold.  He realized when he was trying to get this out that he inhaled some of it and since that time has had ongoing symptoms.  He was evaluated in the emergency room immediately following episode yesterday but did not have a good experience and was not prescribed any medication.  He reports mild headache, nasal congestion, cough, postnasal drainage.  He has not tried any over-the-counter medication for symptom management.  Denies history of allergies or asthma.  He is a smoker.  He does have a small amount of carpet left to remove but denies any additional exposures.  He denies any shortness of breath.   Past Medical History:  Diagnosis Date   Bipolar 1 disorder [facility])    Depression    Medical history non-contributory     Patient Active Problem List   Diagnosis Date Noted   Psychosis (HCC) 01/08/2014   Cannabis abuse 12/21/2013   Major depressive disorder, single episode, severe, specified as with psychotic behavior 12/20/2013   Homicidal ideation 12/19/2013   Intermittent explosive disorder 12/19/2013    Past Surgical History:  Procedure Laterality Date   ACNE CYST REMOVAL     back       Home Medications    Prior to Admission medications   Medication Sig Start Date End Date Taking? Authorizing Provider  cetirizine (ZYRTEC ALLERGY) 10 MG tablet Take 1 tablet (10 mg total) by mouth at bedtime. 10/02/21  Yes Wolf Boulay K, PA-C  albuterol (VENTOLIN HFA) 108 (90 Base) MCG/ACT inhaler Inhale into the lungs every 6 (six) hours as needed for wheezing or shortness of breath.    [provider]  hydrocortisone 2.5 % lotion Apply topically 2 (two) times daily. 05/11/21   Charlynne Pander, MD  lidocaine (XYLOCAINE) 2 % solution Use as directed 15 mLs in the mouth or throat as needed for mouth pain. 09/26/21   Caccavale, Sophia, PA-C  naproxen (NAPROSYN) 500 MG tablet Take 1 tablet (500 mg total) by mouth 2 (two) times daily as needed for moderate pain. 07/21/21   Petrucelli, Pleas Koch, PA-C    Family History History reviewed. No pertinent family history.  Social History Social History   Tobacco Use   Smoking status: Heavy Smoker    Packs/day: 1.50    Types: Cigarettes   Smokeless tobacco: Never  Substance Use Topics   Alcohol use: No   Drug use: Yes    Types: Marijuana     Allergies   Patient has no known allergies.   Review of Systems Review of Systems  Constitutional:  Positive for activity change. Negative for appetite change, fatigue and fever.  HENT:  Positive for congestion, postnasal drip and sore throat. Negative for sinus pressure and sneezing.   Respiratory:  Positive for cough. Negative for shortness of breath.   Cardiovascular:  Negative for chest pain.  Gastrointestinal:  Negative for abdominal pain, diarrhea, nausea and vomiting.  Neurological:  Positive for headaches. Negative for dizziness and light-headedness.    Physical Exam Triage Vital Signs ED  Triage Vitals  Enc Vitals Group     BP 10/02/21 1948 126/73     Pulse Rate 10/02/21 1948 86     Resp 10/02/21 1948 18     Temp 10/02/21 1948 98.6 F (37 C)     Temp Source 10/02/21 1948 Oral     SpO2 10/02/21 1948 96 %     Weight --      Height --      Head Circumference --      Peak Flow --      Pain Score 10/02/21 1950 3     Pain Loc --      Pain Edu? --      Excl. in [place]? --    No data found.  Updated Vital Signs BP 126/73 (BP Location: Left Arm)   Pulse 86   Temp 98.6 F (37 C) (Oral)   Resp 18   SpO2 96%   Visual Acuity Right Eye Distance:   Left Eye Distance:    Bilateral Distance:    Right Eye Near:   Left Eye Near:    Bilateral Near:     Physical Exam Vitals reviewed.  Constitutional:      General: He is awake.     Appearance: Normal appearance. He is well-developed. He is not ill-appearing.     Comments: Very pleasant male appears stated age in no acute distress sitting comfortably in exam room  HENT:     Head: Normocephalic and atraumatic.     Right Ear: Tympanic membrane, ear canal and external ear normal. Tympanic membrane is not erythematous or bulging.     Left Ear: Tympanic membrane, ear canal and external ear normal. Tympanic membrane is not erythematous or bulging.     Nose: Rhinorrhea present. Rhinorrhea is clear.     Mouth/Throat:     Dentition: Abnormal dentition.     Pharynx: Uvula midline. Posterior oropharyngeal erythema present. No oropharyngeal exudate.     Comments: Drainage present posterior oropharynx Cardiovascular:     Rate and Rhythm: Normal rate and regular rhythm.     Heart sounds: Normal heart sounds, S1 normal and S2 normal. No murmur heard. Pulmonary:     Effort: Pulmonary effort is normal. No accessory muscle usage or respiratory distress.     Breath sounds: Normal breath sounds. No stridor. No wheezing, rhonchi or rales.     Comments: Clear to auscultation bilaterally Neurological:     Mental Status: He is alert.  Psychiatric:        Behavior: Behavior is cooperative.     UC Treatments / Results  Labs (all labs ordered are listed, but only abnormal results are displayed) Labs Reviewed - No data to display  EKG   Radiology No results found.  Procedures Procedures (including critical care time)  Medications Ordered in UC Medications - No data to display  Initial Impression / Assessment and Plan / UC Course  I have reviewed the triage vital signs and the nursing notes.  Pertinent labs & imaging results that were available during my care of the patient were reviewed by me and considered in  my medical decision making (see chart for details).     Vital signs and physical exam are reassuring today.  Discussed that ongoing symptoms are likely related to allergic response due to mold exposure.  Patient was started on Zyrtec to help manage symptoms.  Discussed that he should avoid additional exposure and if he needs to remove additional moldy carpet he should  use an N95 while also wearing goggles.  Discussed that if symptoms persist he should be reevaluated particularly if he develops worsening cough or shortness of breath.  Discussed alarm symptoms that warrant emergent evaluation.  Strict return precautions given to which he expressed understanding.  Final Clinical Impressions(s) / UC Diagnoses   Final diagnoses:  Mold exposure  Nasal congestion  Post-nasal drainage  Acute cough     Discharge Instructions      It is very important that we avoid additional exposures.  If you are going to have to remove the carpet with mold please wear an N95 and goggles in the make sure to take a shower immediately after.  Start Zyrtec daily.  Make sure you rest and drink plenty of fluid.  If you have persistent or worsening symptoms please return for reevaluation as we discussed.     ED Prescriptions     Medication Sig Dispense Auth. Provider   cetirizine (ZYRTEC ALLERGY) 10 MG tablet Take 1 tablet (10 mg total) by mouth at bedtime. 30 tablet Kishon Garriga, Derry Skill, PA-C      PDMP not reviewed this encounter.   Terrilee Croak, PA-C 10/02/21 2021

## 2021-10-02 NOTE — ED Triage Notes (Signed)
Pt reports mold exposure from his car in the last week. Reports cough, eye and throat irritation

## 2021-10-21 ENCOUNTER — Emergency Department (HOSPITAL_COMMUNITY)
Admission: EM | Admit: 2021-10-21 | Discharge: 2021-10-21 | Disposition: A | Discharge status: Home / Self Care | Payer: Self-pay | Attending: Emergency Medicine | Admitting: Emergency Medicine

## 2021-10-21 ENCOUNTER — Other Ambulatory Visit: Payer: Self-pay

## 2021-10-21 DIAGNOSIS — U071 COVID-19: Secondary | ICD-10-CM | POA: Insufficient documentation

## 2021-10-21 DIAGNOSIS — F17210 Nicotine dependence, cigarettes, uncomplicated: Secondary | ICD-10-CM | POA: Insufficient documentation

## 2021-10-21 LAB — RESP PANEL BY RT-PCR (FLU A&B, COVID) ARPGX2
Influenza A by PCR: NEGATIVE
Influenza B by PCR: NEGATIVE
SARS Coronavirus 2 by RT PCR: POSITIVE — AB

## 2021-10-21 MED ORDER — IBUPROFEN 600 MG PO TABS: 600.0000 mg | ORAL_TABLET | Freq: Four times a day (QID) | ORAL | 0 refills | Status: AC | PRN

## 2021-10-21 MED ORDER — ALBUTEROL SULFATE HFA 108 (90 BASE) MCG/ACT IN AERS: 1.0000 | INHALATION_SPRAY | Freq: Four times a day (QID) | RESPIRATORY_TRACT | 0 refills | Status: AC | PRN

## 2021-10-21 MED ORDER — ACETAMINOPHEN 500 MG PO TABS
1000.0000 mg | ORAL_TABLET | Freq: Once | ORAL | Status: AC
  Administered 2021-10-21: 08:00:00 1000 mg via ORAL
  Filled 2021-10-21: qty 2

## 2021-10-21 MED ORDER — BENZONATATE 100 MG PO CAPS: 100.0000 mg | ORAL_CAPSULE | Freq: Three times a day (TID) | ORAL | 0 refills | Status: AC

## 2021-10-21 NOTE — Discharge Instructions (Signed)
You were seen here today for evaluation of your cough. You were diagnosed with COVID. You had been prescribed Tessalon Perles and albuterol to help with your cough and to use as needed.  If you have any chest pain, syncope, coughing up blood, or shortness of breath, please return the nearest emergency department for reevaluation.

## 2021-10-21 NOTE — ED Triage Notes (Signed)
Pt reports nasal congestion, cough and chills since last night. Tmax 101.

## 2021-10-21 NOTE — ED Provider Notes (Signed)
[facility] EMERGENCY DEPARTMENT Provider Note   CSN: [id number] Arrival date & time: 10/21/21  0757     History Chief Complaint  Patient presents with   Cough    Benjamin Butler is a 45 y.o. male presents to the ED for eval of cough, subjective fever, myalgias, and headache since yesterday. Denies any GI symptoms, chest pain, SOB, rhinorrhea, nasal congestion, or sore throat. No medications trialed. Medical history include bipolar. Daily smoker. NKDA.   Cough Associated symptoms: fever, headaches and myalgias   Associated symptoms: no chest pain, no chills, no ear pain, no rash, no shortness of breath and no sore throat       Past Medical History:  Diagnosis Date   Bipolar 1 disorder (HCC)    Depression    Medical history non-contributory     Patient Active Problem List   Diagnosis Date Noted   Psychosis (HCC) 01/08/2014   Cannabis abuse 12/21/2013   Major depressive disorder, single episode, severe, specified as with psychotic behavior 12/20/2013   Homicidal ideation 12/19/2013   Intermittent explosive disorder 12/19/2013    Past Surgical History:  Procedure Laterality Date   ACNE CYST REMOVAL     back       No family history on file.  Social History   Tobacco Use   Smoking status: Heavy Smoker    Packs/day: 1.50    Types: Cigarettes   Smokeless tobacco: Never  Substance Use Topics   Alcohol use: No   Drug use: Yes    Types: Marijuana    Home Medications Prior to Admission medications   Medication Sig Start Date End Date Taking? Authorizing Provider  albuterol (VENTOLIN HFA) 108 (90 Base) MCG/ACT inhaler Inhale 1-2 puffs into the lungs every 6 (six) hours as needed for wheezing or shortness of breath. 10/21/21  Yes Achille Rich, PA-C  benzonatate (TESSALON) 100 MG capsule Take 1 capsule (100 mg total) by mouth every 8 (eight) hours. 10/21/21  Yes Achille Rich, PA-C  ibuprofen (ADVIL) 600 MG tablet Take 1 tablet (600 mg total) by  mouth every 6 (six) hours as needed. 10/21/21  Yes Achille Rich, PA-C  albuterol (VENTOLIN HFA) 108 (90 Base) MCG/ACT inhaler Inhale into the lungs every 6 (six) hours as needed for wheezing or shortness of breath.    [provider]  cetirizine (ZYRTEC ALLERGY) 10 MG tablet Take 1 tablet (10 mg total) by mouth at bedtime. 10/02/21   Raspet, Noberto Retort, PA-C  hydrocortisone 2.5 % lotion Apply topically 2 (two) times daily. 05/11/21   Charlynne Pander, MD  lidocaine (XYLOCAINE) 2 % solution Use as directed 15 mLs in the mouth or throat as needed for mouth pain. 09/26/21   Caccavale, Sophia, PA-C    Allergies    Patient has no known allergies.  Review of Systems   Review of Systems  Constitutional:  Positive for fever. Negative for chills.  HENT:  Negative for ear pain and sore throat.   Eyes:  Negative for pain and visual disturbance.  Respiratory:  Positive for cough. Negative for shortness of breath.   Cardiovascular:  Negative for chest pain and palpitations.  Gastrointestinal:  Negative for abdominal pain and vomiting.  Genitourinary:  Negative for dysuria and hematuria.  Musculoskeletal:  Positive for myalgias. Negative for arthralgias and back pain.  Skin:  Negative for color change and rash.  Neurological:  Positive for headaches. Negative for seizures and syncope.  All other systems reviewed and are negative.  Physical Exam Updated Vital Signs BP 114/70 (BP Location: Right Arm)    Pulse 92    Temp 99 F (37.2 C) (Oral)    Resp 20    SpO2 96%   Physical Exam Vitals and nursing note reviewed.  Constitutional:      General: He is not in acute distress.    Appearance: He is not toxic-appearing.  HENT:     Head: Normocephalic and atraumatic.     Right Ear: Tympanic membrane, ear canal and external ear normal.     Left Ear: Tympanic membrane, ear canal and external ear normal.     Nose:     Comments: Bilateral nasal turbinate edema and erythema with scant clear nasal  discharge.    Mouth/Throat:     Mouth: Mucous membranes are moist.     Comments: No pharyngeal erythema, exudate, or edema noted.  Uvula midline.  Airway patent.  Moist mucous membranes. Eyes:     General: No scleral icterus.    Conjunctiva/sclera: Conjunctivae normal.  Cardiovascular:     Rate and Rhythm: Normal rate.  Pulmonary:     Effort: Pulmonary effort is normal. No respiratory distress.     Comments: Mild expiratory wheezing. Patient speaking in full sentences with ease. Satting 96% on RA. Abdominal:     General: Abdomen is flat.     Palpations: Abdomen is soft.  Musculoskeletal:        General: No deformity.     Cervical back: Normal range of motion.  Skin:    Findings: No rash.  Neurological:     General: No focal deficit present.     Mental Status: He is alert.    ED Results / Procedures / Treatments   Labs (all labs ordered are listed, but only abnormal results are displayed) Labs Reviewed  RESP PANEL BY RT-PCR (FLU A&B, COVID) ARPGX2 - Abnormal; Notable for the following components:      Result Value   SARS Coronavirus 2 by RT PCR POSITIVE (*)    All other components within normal limits    EKG None  Radiology No results found.  Procedures Procedures   Medications Ordered in ED Medications  acetaminophen (TYLENOL) tablet 1,000 mg (1,000 mg Oral Given 10/21/21 0805)    ED Course  I have reviewed the triage vital signs and the nursing notes.  Pertinent labs & imaging results that were available during my care of the patient were reviewed by me and considered in my medical decision making (see chart for details).  45 y/o M presents to the ED for eval of FLS since yesterday. The patient presented febrile at 100.88F, but hemodynamically stable. Given Tylenol in triage. Physical exam showed some nasal turbinate erythema and some mild exp wheezing. The patient tested positive for COVID, negative for flu. Recommended ibuprofen with albuterol and tessalon  pearles. Warned patient about ibuprofen and OTC cough and cold medication interactions. Discussed quarantine. Return precautions given. Patient agrees to plan. The patient is stable and being sent home in good condition.     MDM Rules/Calculators/A&P                          Final Clinical Impression(s) / ED Diagnoses Final diagnoses:  COVID    Rx / DC Orders ED Discharge Orders          Ordered    ibuprofen (ADVIL) 600 MG tablet  Every 6 hours PRN  10/21/21 1140    albuterol (VENTOLIN HFA) 108 (90 Base) MCG/ACT inhaler  Every 6 hours PRN        10/21/21 1140    benzonatate (TESSALON) 100 MG capsule  Every 8 hours        10/21/21 1140             Sherrell Puller, [place] 10/22/21 [id number]    Truddie Hidden, MD 10/22/21 [phone]

## 2022-04-23 IMAGING — CR DG CHEST 2V
2 series · 2 of 2 positions shown · non-contrast
Comparison: 05/30/2020

CLINICAL DATA: Dyspnea

EXAM:
CHEST - 2 VIEW

[chest pa]
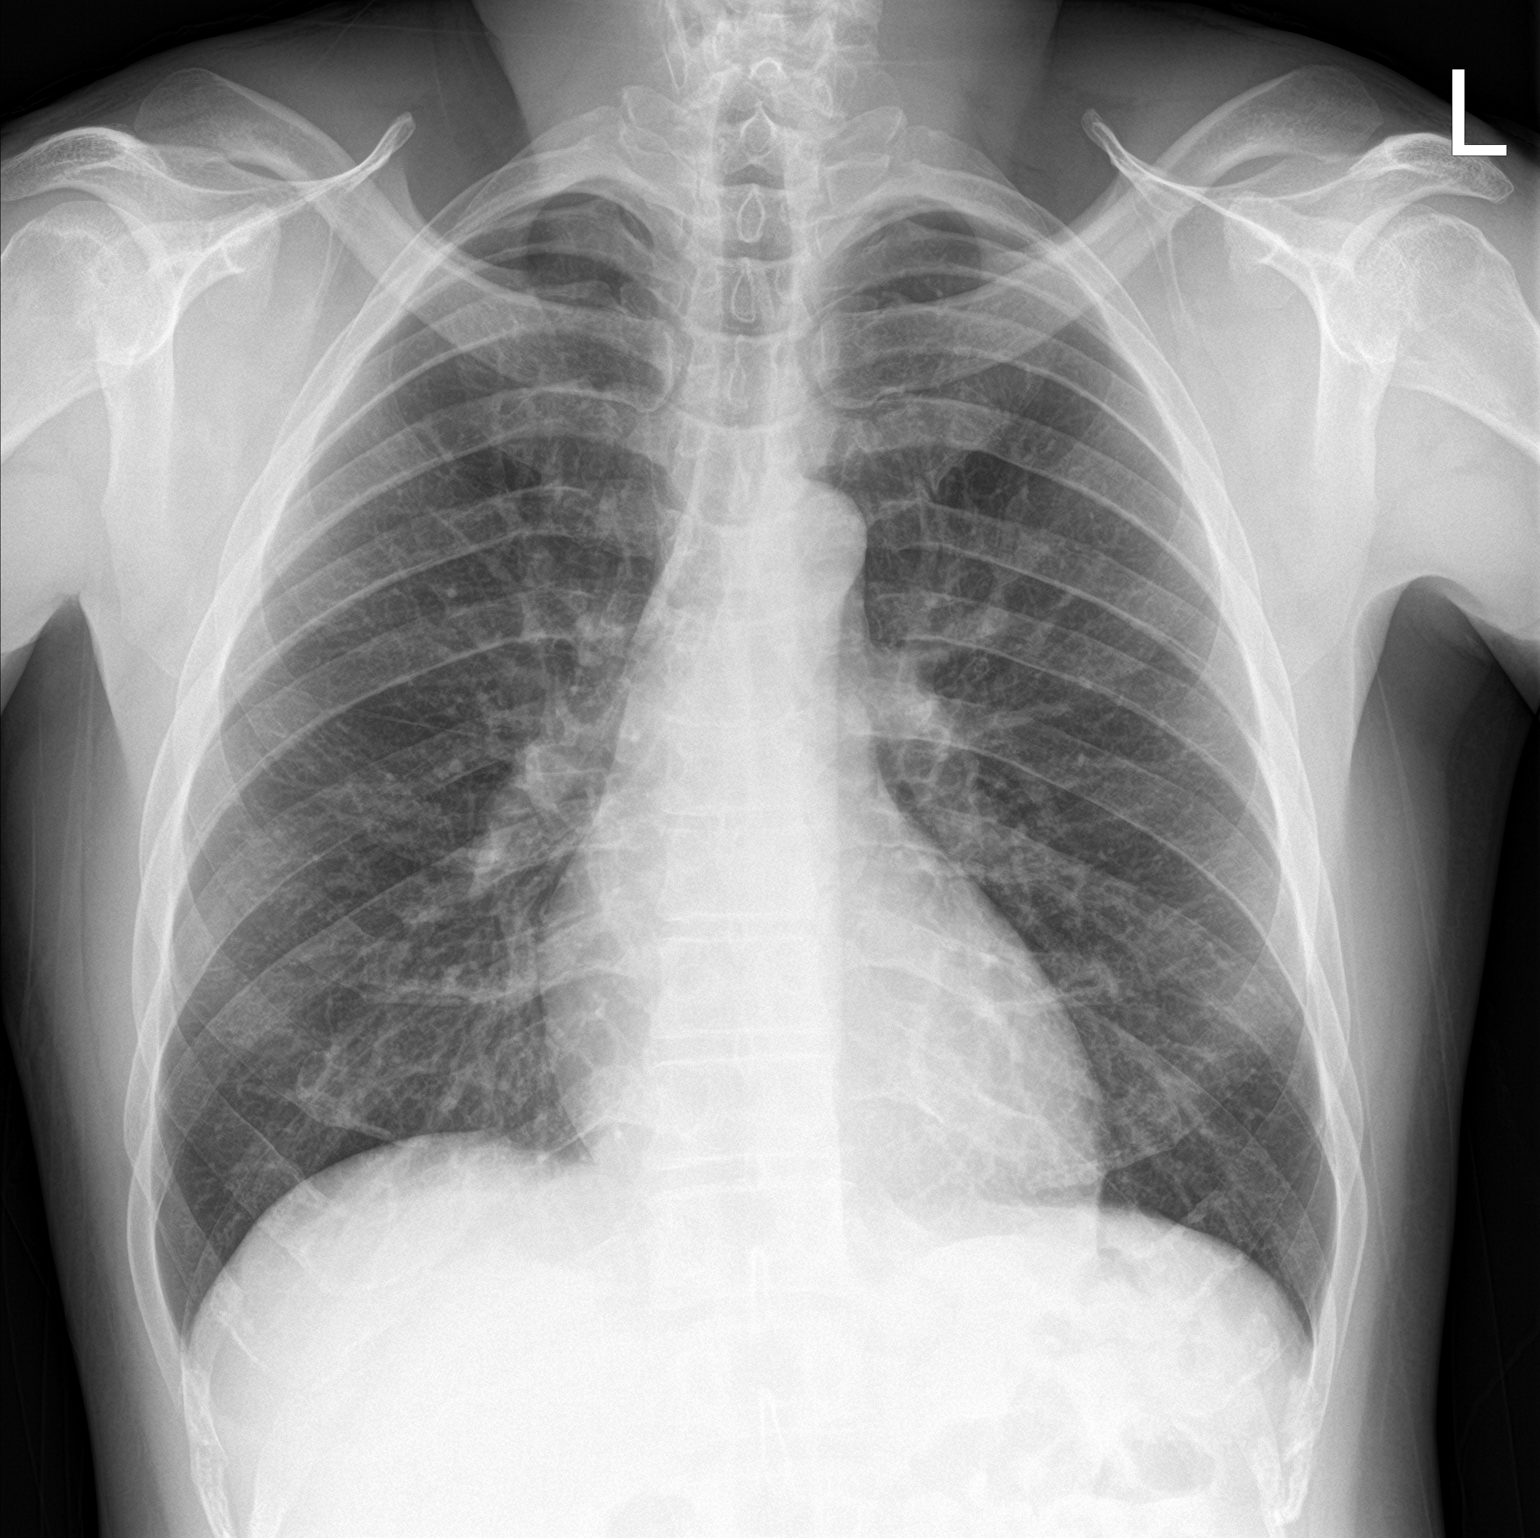

[chest lat]
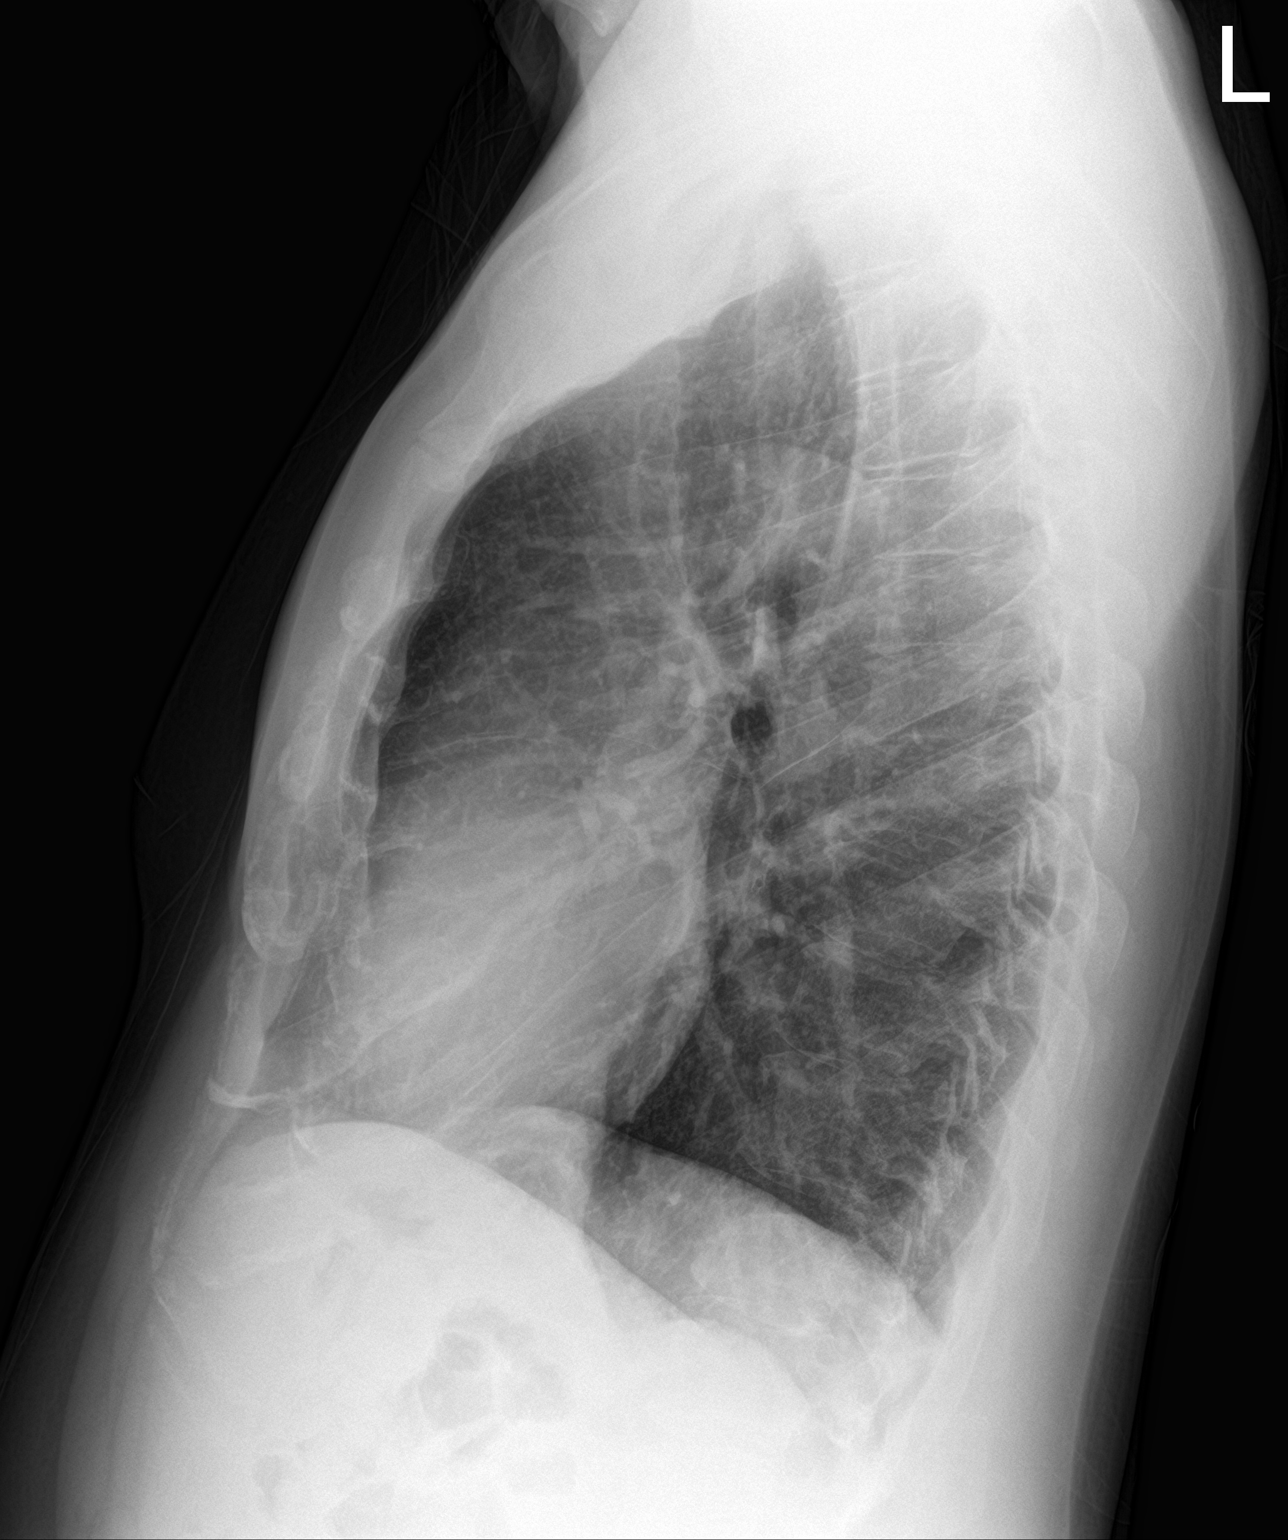

[2 of 2 positions shown; findings below may reference images not displayed]

FINDINGS: The heart size and mediastinal contours are within normal limits.
Both lungs are clear. The visualized skeletal structures are
unremarkable.
IMPRESSION: No active cardiopulmonary disease.

## 2023-04-12 IMAGING — CR DG CHEST 2V
2 series · 2 of 2 positions shown · non-contrast
Comparison: 06/13/2020

CLINICAL DATA: Generalized weakness, tick bite 2 weeks ago, febrile

EXAM:
CHEST - 2 VIEW

[chest pa]
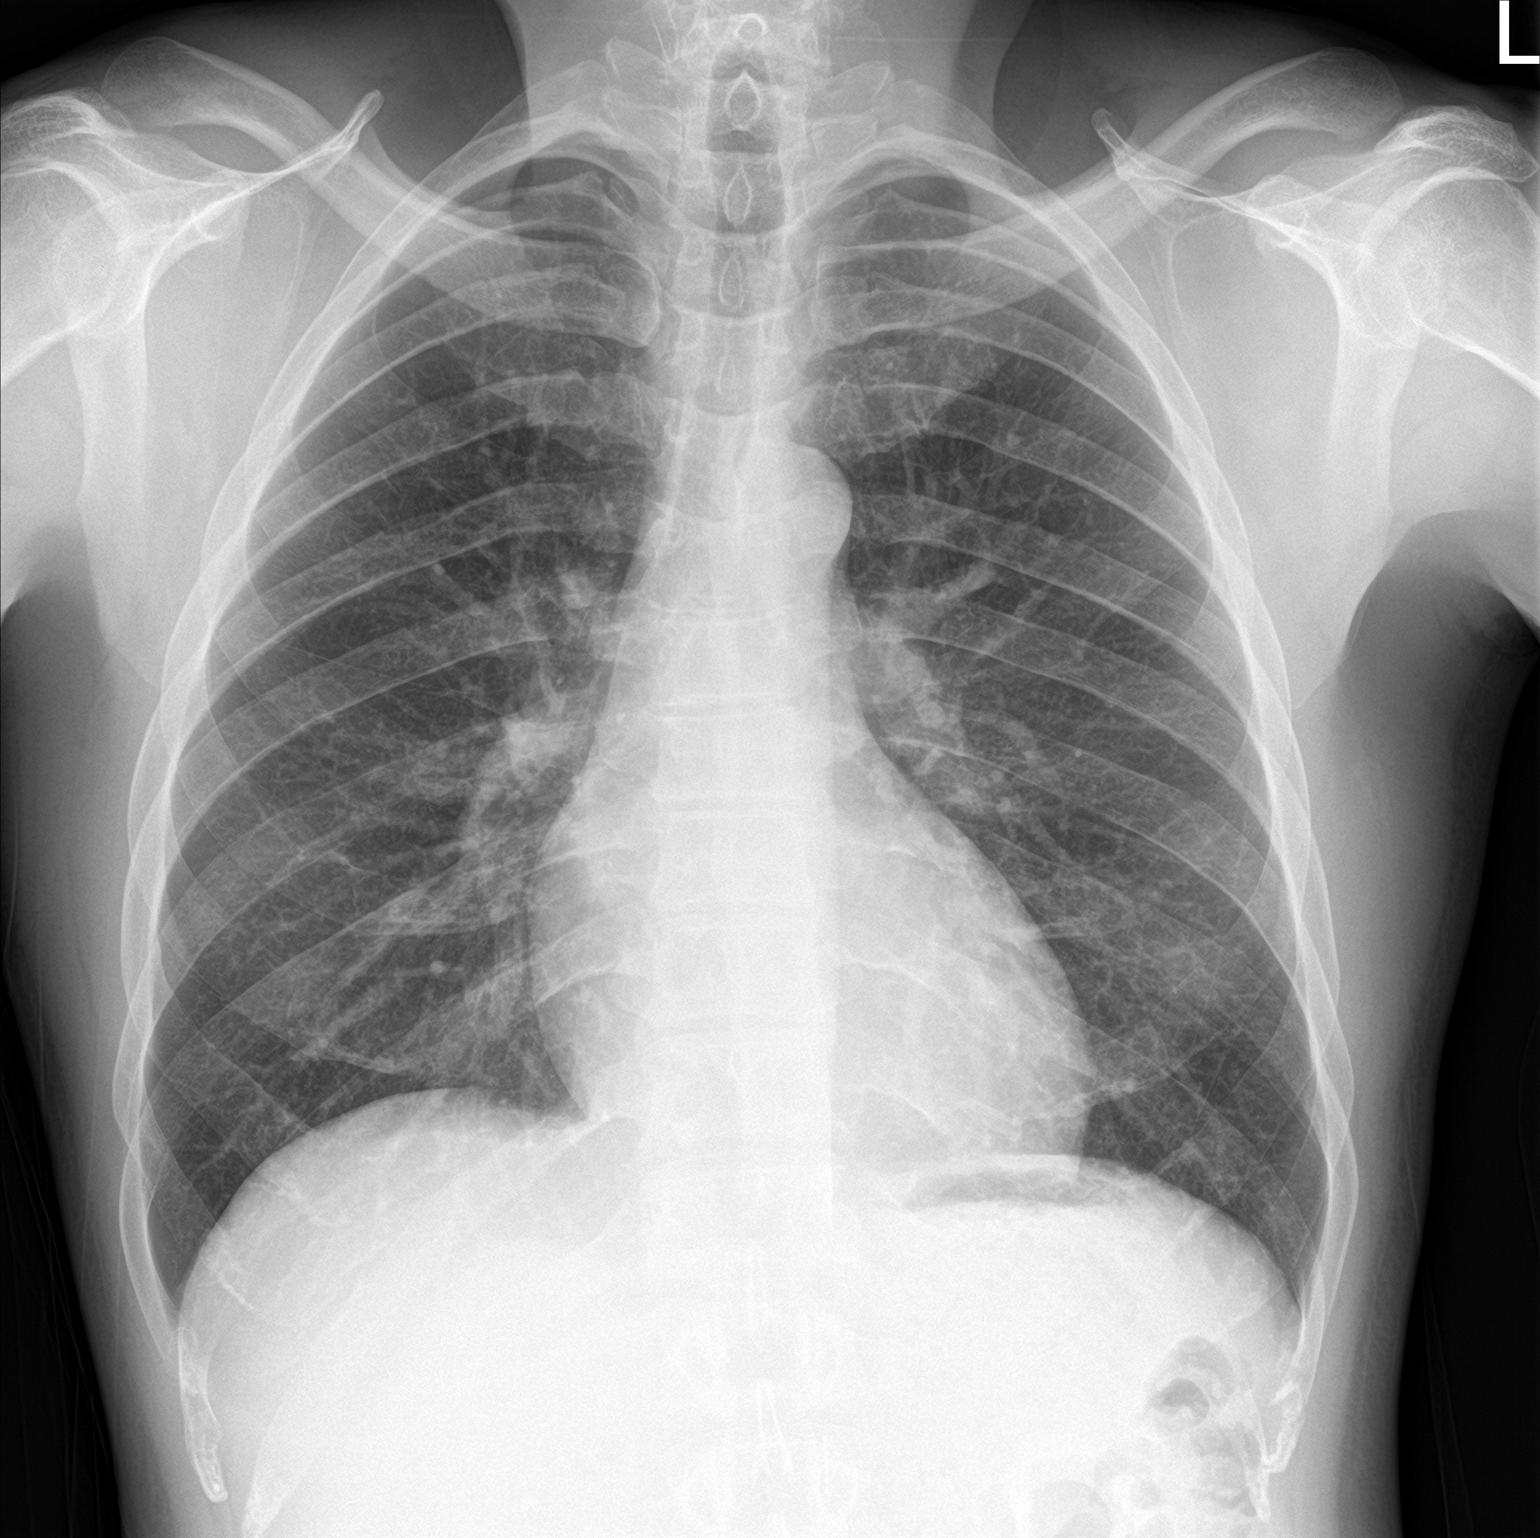

[chest lat]
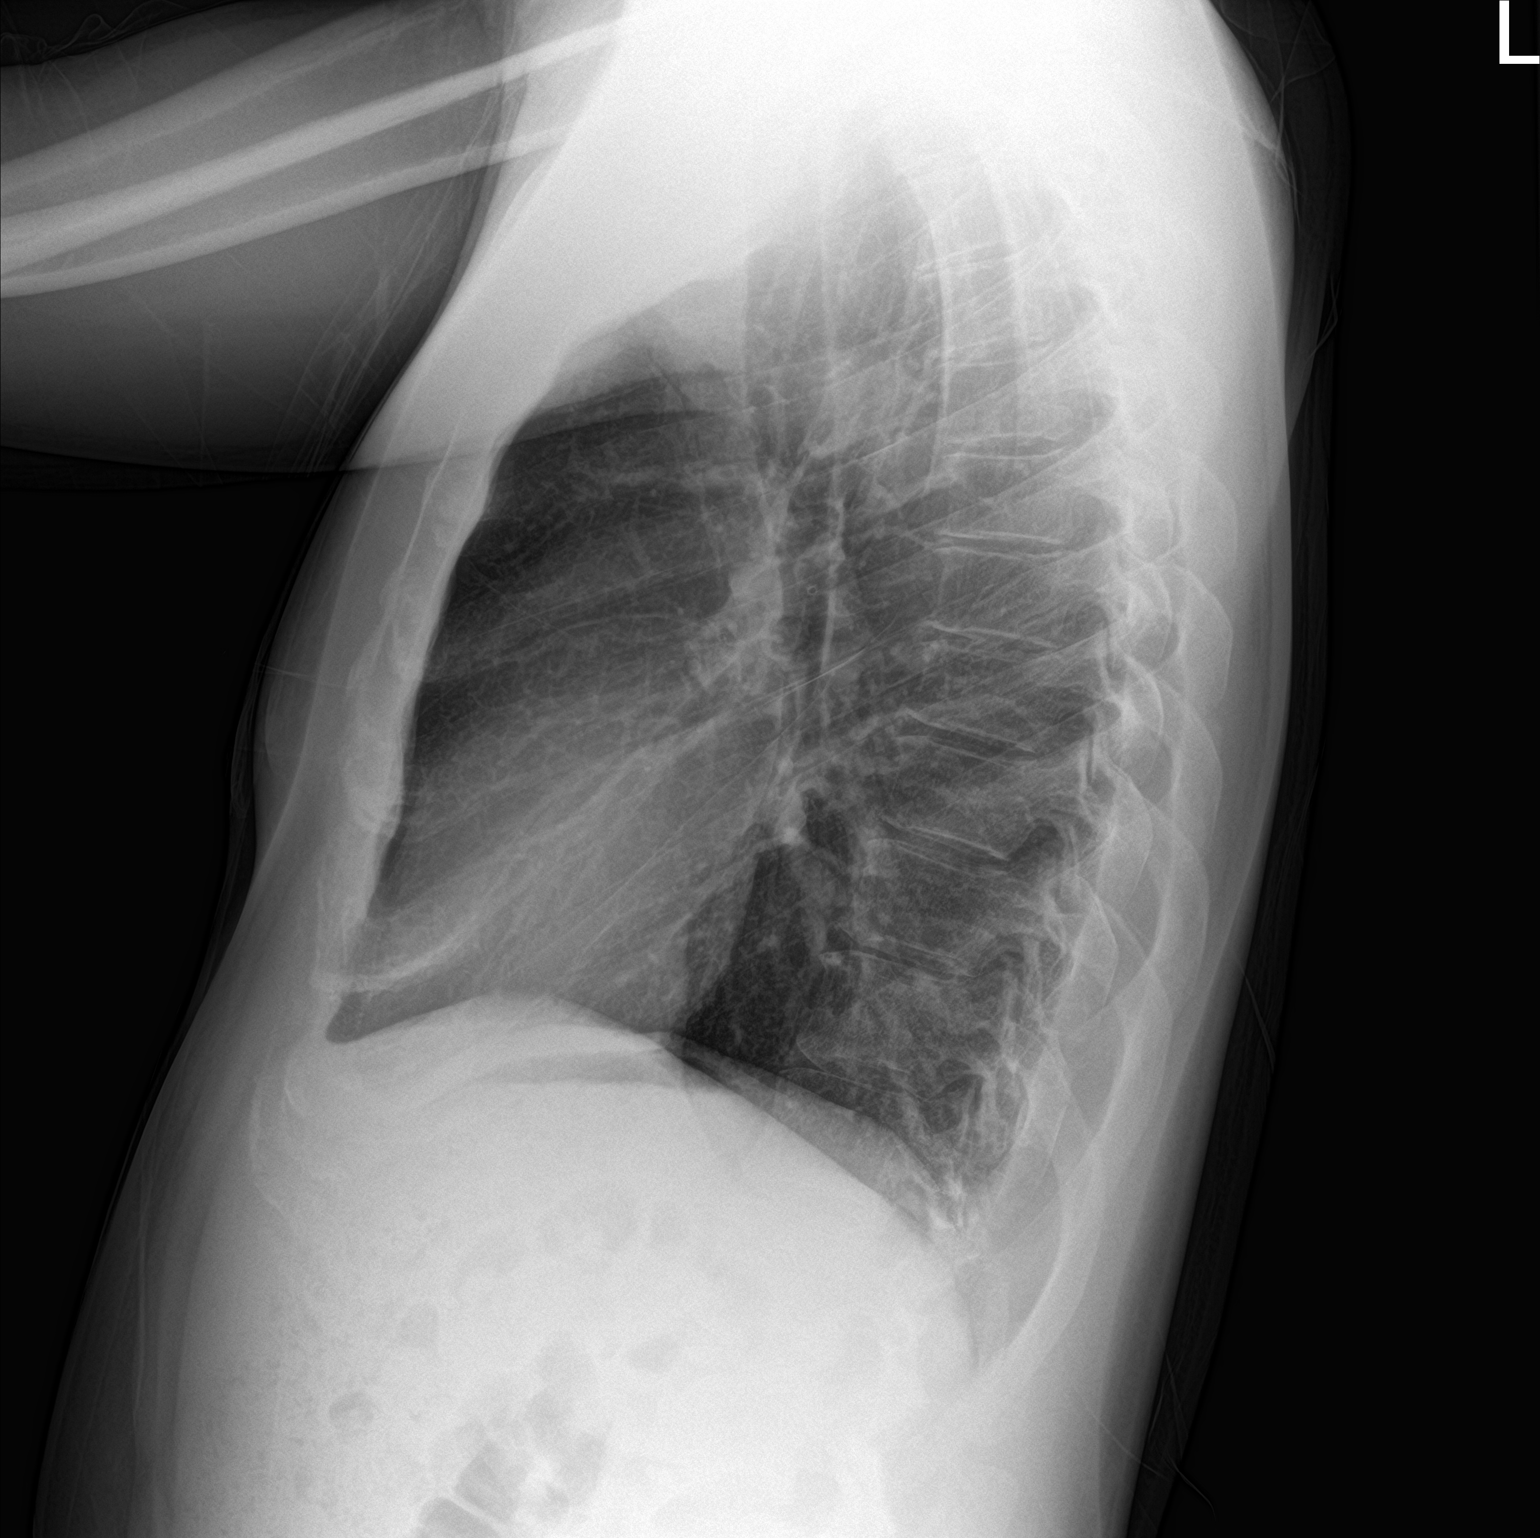

[2 of 2 positions shown; findings below may reference images not displayed]

FINDINGS: Frontal and lateral views of the chest demonstrate an unremarkable
cardiac silhouette. No acute airspace disease, effusion, or
pneumothorax. No acute bony abnormalities.
IMPRESSION: 1. No acute intrathoracic process.

## 2023-07-19 ENCOUNTER — Encounter: Payer: Self-pay | Admitting: Internal Medicine
